# Patient Record
Sex: Female | Born: 1970
Health system: Southern US, Community
[De-identification: ages and names within clinical notes are randomized; demographics above are authoritative.]

## PROBLEM LIST (undated history)

## (undated) ENCOUNTER — Inpatient Hospital Stay: Payer: Self-pay

## (undated) DIAGNOSIS — K649 Unspecified hemorrhoids: Secondary | ICD-10-CM

## (undated) DIAGNOSIS — E039 Hypothyroidism, unspecified: Secondary | ICD-10-CM

## (undated) DIAGNOSIS — E079 Disorder of thyroid, unspecified: Secondary | ICD-10-CM

## (undated) DIAGNOSIS — J029 Acute pharyngitis, unspecified: Secondary | ICD-10-CM

## (undated) DIAGNOSIS — H6691 Otitis media, unspecified, right ear: Secondary | ICD-10-CM

## (undated) DIAGNOSIS — R0981 Nasal congestion: Secondary | ICD-10-CM

## (undated) DIAGNOSIS — R05 Cough: Secondary | ICD-10-CM

## (undated) DIAGNOSIS — N898 Other specified noninflammatory disorders of vagina: Secondary | ICD-10-CM

## (undated) DIAGNOSIS — K5900 Constipation, unspecified: Secondary | ICD-10-CM

## (undated) HISTORY — DX: Constipation, unspecified: K59.00

## (undated) HISTORY — DX: Otitis media, unspecified, right ear: H66.91

## (undated) HISTORY — DX: Unspecified hemorrhoids: K64.9

## (undated) HISTORY — DX: Disorder of thyroid, unspecified: E07.9

## (undated) HISTORY — PX: BAND HEMORRHOIDECTOMY: SHX1213

## (undated) HISTORY — DX: Nasal congestion: R09.81

## (undated) HISTORY — DX: Other specified noninflammatory disorders of vagina: N89.8

## (undated) HISTORY — DX: Hypothyroidism, unspecified: E03.9

## (undated) HISTORY — DX: Acute pharyngitis, unspecified: J02.9

## (undated) HISTORY — DX: Cough: R05

---

## 2007-07-31 ENCOUNTER — Other Ambulatory Visit: Admission: RE | Admit: 2007-07-31 | Discharge: 2007-07-31 | Payer: Self-pay | Admitting: Obstetrics and Gynecology

## 2007-08-07 ENCOUNTER — Ambulatory Visit (HOSPITAL_COMMUNITY): Admission: RE | Admit: 2007-08-07 | Discharge: 2007-08-07 | Payer: Self-pay | Admitting: Obstetrics & Gynecology

## 2010-04-22 ENCOUNTER — Other Ambulatory Visit
Admission: RE | Admit: 2010-04-22 | Discharge: 2010-04-22 | Payer: Self-pay | Source: Home / Self Care | Admitting: Obstetrics and Gynecology

## 2010-05-25 ENCOUNTER — Other Ambulatory Visit: Payer: Self-pay | Admitting: Obstetrics & Gynecology

## 2010-05-25 DIAGNOSIS — R1011 Right upper quadrant pain: Secondary | ICD-10-CM

## 2010-05-27 ENCOUNTER — Ambulatory Visit (HOSPITAL_COMMUNITY)
Admission: RE | Admit: 2010-05-27 | Discharge: 2010-05-27 | Disposition: A | Discharge status: Home / Self Care | Payer: 59 | Source: Ambulatory Visit | Attending: Obstetrics & Gynecology | Admitting: Obstetrics & Gynecology

## 2010-05-27 DIAGNOSIS — R1011 Right upper quadrant pain: Secondary | ICD-10-CM | POA: Insufficient documentation

## 2010-05-30 ENCOUNTER — Emergency Department (HOSPITAL_COMMUNITY)
Admission: EM | Admit: 2010-05-30 | Discharge: 2010-05-30 | Disposition: A | Discharge status: Home / Self Care | Payer: 59 | Attending: Emergency Medicine | Admitting: Emergency Medicine

## 2010-05-30 DIAGNOSIS — L509 Urticaria, unspecified: Secondary | ICD-10-CM | POA: Insufficient documentation

## 2010-10-17 ENCOUNTER — Ambulatory Visit (INDEPENDENT_AMBULATORY_CARE_PROVIDER_SITE_OTHER): Payer: 59 | Admitting: Internal Medicine

## 2010-11-01 ENCOUNTER — Ambulatory Visit (INDEPENDENT_AMBULATORY_CARE_PROVIDER_SITE_OTHER): Payer: 59 | Admitting: Internal Medicine

## 2011-09-14 ENCOUNTER — Other Ambulatory Visit: Payer: Self-pay | Admitting: Adult Health

## 2011-09-14 DIAGNOSIS — Z139 Encounter for screening, unspecified: Secondary | ICD-10-CM

## 2011-09-19 ENCOUNTER — Ambulatory Visit (HOSPITAL_COMMUNITY)
Admission: RE | Admit: 2011-09-19 | Discharge: 2011-09-19 | Disposition: A | Discharge status: Home / Self Care | Payer: 59 | Source: Ambulatory Visit | Attending: Adult Health | Admitting: Adult Health

## 2011-09-19 DIAGNOSIS — Z1231 Encounter for screening mammogram for malignant neoplasm of breast: Secondary | ICD-10-CM | POA: Insufficient documentation

## 2011-09-19 DIAGNOSIS — Z139 Encounter for screening, unspecified: Secondary | ICD-10-CM

## 2011-09-21 ENCOUNTER — Other Ambulatory Visit (HOSPITAL_COMMUNITY)
Admission: RE | Admit: 2011-09-21 | Discharge: 2011-09-21 | Disposition: A | Discharge status: Home / Self Care | Payer: 59 | Source: Ambulatory Visit | Attending: Obstetrics and Gynecology | Admitting: Obstetrics and Gynecology

## 2011-09-21 ENCOUNTER — Other Ambulatory Visit: Payer: Self-pay | Admitting: Adult Health

## 2011-09-21 DIAGNOSIS — Z01419 Encounter for gynecological examination (general) (routine) without abnormal findings: Secondary | ICD-10-CM | POA: Insufficient documentation

## 2011-09-21 DIAGNOSIS — Z1159 Encounter for screening for other viral diseases: Secondary | ICD-10-CM | POA: Insufficient documentation

## 2011-09-25 ENCOUNTER — Other Ambulatory Visit: Payer: Self-pay | Admitting: Adult Health

## 2011-09-27 ENCOUNTER — Ambulatory Visit (HOSPITAL_COMMUNITY)
Admission: RE | Admit: 2011-09-27 | Discharge: 2011-09-27 | Disposition: A | Discharge status: Home / Self Care | Payer: 59 | Source: Ambulatory Visit | Attending: Adult Health | Admitting: Adult Health

## 2011-09-27 DIAGNOSIS — R928 Other abnormal and inconclusive findings on diagnostic imaging of breast: Secondary | ICD-10-CM | POA: Insufficient documentation

## 2012-02-08 ENCOUNTER — Other Ambulatory Visit: Payer: Self-pay | Admitting: Obstetrics & Gynecology

## 2012-02-08 ENCOUNTER — Ambulatory Visit (HOSPITAL_COMMUNITY)
Admission: RE | Admit: 2012-02-08 | Discharge: 2012-02-08 | Disposition: A | Discharge status: Home / Self Care | Payer: 59 | Source: Ambulatory Visit | Attending: Obstetrics & Gynecology | Admitting: Obstetrics & Gynecology

## 2012-02-08 DIAGNOSIS — S59909A Unspecified injury of unspecified elbow, initial encounter: Secondary | ICD-10-CM | POA: Insufficient documentation

## 2012-02-08 DIAGNOSIS — S59919A Unspecified injury of unspecified forearm, initial encounter: Secondary | ICD-10-CM | POA: Insufficient documentation

## 2012-02-08 DIAGNOSIS — S6990XA Unspecified injury of unspecified wrist, hand and finger(s), initial encounter: Secondary | ICD-10-CM

## 2012-02-08 DIAGNOSIS — W19XXXA Unspecified fall, initial encounter: Secondary | ICD-10-CM | POA: Insufficient documentation

## 2012-02-12 ENCOUNTER — Ambulatory Visit (HOSPITAL_COMMUNITY)
Admission: RE | Admit: 2012-02-12 | Discharge: 2012-02-12 | Disposition: A | Discharge status: Home / Self Care | Payer: 59 | Source: Ambulatory Visit | Attending: Adult Health | Admitting: Adult Health

## 2012-02-12 ENCOUNTER — Other Ambulatory Visit: Payer: Self-pay | Admitting: Adult Health

## 2012-02-12 DIAGNOSIS — W19XXXA Unspecified fall, initial encounter: Secondary | ICD-10-CM | POA: Insufficient documentation

## 2012-02-12 DIAGNOSIS — R079 Chest pain, unspecified: Secondary | ICD-10-CM | POA: Insufficient documentation

## 2012-02-12 DIAGNOSIS — R52 Pain, unspecified: Secondary | ICD-10-CM

## 2012-02-12 DIAGNOSIS — S298XXA Other specified injuries of thorax, initial encounter: Secondary | ICD-10-CM | POA: Insufficient documentation

## 2012-10-31 ENCOUNTER — Other Ambulatory Visit: Payer: 59

## 2012-10-31 DIAGNOSIS — Z1329 Encounter for screening for other suspected endocrine disorder: Secondary | ICD-10-CM

## 2012-10-31 DIAGNOSIS — Z13220 Encounter for screening for lipoid disorders: Secondary | ICD-10-CM

## 2012-10-31 DIAGNOSIS — Z0000 Encounter for general adult medical examination without abnormal findings: Secondary | ICD-10-CM

## 2012-10-31 LAB — CBC
Hemoglobin: 13.4 g/dL (ref 12.0–15.0)
MCH: 29.3 pg (ref 26.0–34.0)
MCV: 88.2 fL (ref 78.0–100.0)
RBC: 4.57 MIL/uL (ref 3.87–5.11)

## 2012-11-01 LAB — LIPID PANEL
Cholesterol: 151 mg/dL (ref 0–200)
HDL: 46 mg/dL (ref >39)
Total CHOL/HDL Ratio: 3.3 Ratio
Triglycerides: 52 mg/dL (ref <150)
VLDL: 10 mg/dL (ref 0–40)

## 2012-11-01 LAB — COMPREHENSIVE METABOLIC PANEL
BUN: 12 mg/dL (ref 6–23)
CO2: 28 mEq/L (ref 19–32)
Creat: 0.78 mg/dL (ref 0.50–1.10)
Glucose, Bld: 87 mg/dL (ref 70–99)
Total Bilirubin: 0.4 mg/dL (ref 0.3–1.2)

## 2012-11-01 LAB — THYROID PANEL WITH TSH: Free Thyroxine Index: 3.9 (ref 1.0–3.9)

## 2012-12-04 ENCOUNTER — Other Ambulatory Visit: Payer: Self-pay | Admitting: Adult Health

## 2013-01-06 ENCOUNTER — Other Ambulatory Visit: Payer: Self-pay | Admitting: *Deleted

## 2013-01-06 MED ORDER — PERMETHRIN 5 % EX CREA: TOPICAL_CREAM | Freq: Once | CUTANEOUS | Status: DC

## 2013-01-06 NOTE — Addendum Note (Signed)
Addended by: Richardson Chiquito on: 01/06/2013 05:54 PM   Modules accepted: Medications

## 2013-01-06 NOTE — Addendum Note (Signed)
Addended by: Cyril Mourning A on: 01/06/2013 05:56 PM   Modules accepted: Orders

## 2013-02-12 ENCOUNTER — Ambulatory Visit (INDEPENDENT_AMBULATORY_CARE_PROVIDER_SITE_OTHER): Payer: 59 | Admitting: Adult Health

## 2013-02-12 ENCOUNTER — Encounter: Payer: Self-pay | Admitting: Adult Health

## 2013-02-12 VITALS — BP 108/60 | HR 74 | Ht 63.0 in | Wt 147.0 lb

## 2013-02-12 DIAGNOSIS — E039 Hypothyroidism, unspecified: Secondary | ICD-10-CM

## 2013-02-12 DIAGNOSIS — Z01419 Encounter for gynecological examination (general) (routine) without abnormal findings: Secondary | ICD-10-CM

## 2013-02-12 DIAGNOSIS — K649 Unspecified hemorrhoids: Secondary | ICD-10-CM | POA: Insufficient documentation

## 2013-02-12 DIAGNOSIS — Z1212 Encounter for screening for malignant neoplasm of rectum: Secondary | ICD-10-CM

## 2013-02-12 HISTORY — DX: Hypothyroidism, unspecified: E03.9

## 2013-02-12 HISTORY — DX: Unspecified hemorrhoids: K64.9

## 2013-02-12 LAB — HEMOCCULT GUIAC POC 1CARD (OFFICE)

## 2013-02-12 NOTE — Progress Notes (Signed)
Patient ID: Kristen Hayes, female   DOB: 1970-10-23, 42 y.o.   MRN: [id number] History of Present Illness: Kristen Hayes is a 42 year old white female married in for physical. Had normal pap with negative HPV 09/21/11.  Current Medications, Allergies, Past Medical History, Past Surgical History, Family History and Social History were reviewed in [employer] record.     Review of Systems: Patient denies any headaches, blurred vision, shortness of breath, chest pain, abdominal pain, problems with bowel movements(sonstioation often), urination, or intercourse. No joint pain or mood swings.    Physical Exam:BP 108/60  Pulse 74  Ht 5\' 3"  (1.6 m)  Wt 147 lb (66.679 kg)  BMI 26.05 kg/m2  LMP 01/19/2013 General:  Well developed, well nourished, no acute distress Skin:  Warm and dry Neck:  Midline trachea, normal thyroid Lungs; Clear to auscultation bilaterally Breast:  No dominant palpable mass, retraction, or nipple discharge Cardiovascular: Regular rate and rhythm Abdomen:  Soft, non tender, no hepatosplenomegaly Pelvic:  External genitalia is normal in appearance.  The vagina is normal in appearance.    The cervix is bulbous.  Uterus is felt to be normal size, shape, and contour.  No adnexal masses or tenderness noted. Rectal: Good sphincter tone, no polyps,internal hemorrhoids felt.  Hemoccult negative. Extremities:  No swelling or varicosities noted Psych:  No mood changes,alert and cooperative.   Impression: Yearly gyn exam no pap Hemorrhoids Hypothyroid     Plan: Physical in 1 year Mammogram yearly Continue synthroid Use anusol prn

## 2013-02-12 NOTE — Patient Instructions (Signed)
Physical in 1 year Mammogram yearly  Hemorrhoids Hemorrhoids are swollen veins around the rectum or anus. There are two types of hemorrhoids:   Internal hemorrhoids. These occur in the veins just inside the rectum. They may poke through to the outside and become irritated and painful.  External hemorrhoids. These occur in the veins outside the anus and can be felt as a painful swelling or hard lump near the anus. CAUSES  Pregnancy.   Obesity.   Constipation or diarrhea.   Straining to have a bowel movement.   Sitting for long periods on the toilet.  Heavy lifting or other activity that caused you to strain.  Anal intercourse. SYMPTOMS   Pain.   Anal itching or irritation.   Rectal bleeding.   Fecal leakage.   Anal swelling.   One or more lumps around the anus.  DIAGNOSIS  Your caregiver may be able to diagnose hemorrhoids by visual examination. Other examinations or tests that may be performed include:   Examination of the rectal area with a gloved hand (digital rectal exam).   Examination of anal canal using a small tube (scope).   A blood test if you have lost a significant amount of blood.  A test to look inside the colon (sigmoidoscopy or colonoscopy). TREATMENT Most hemorrhoids can be treated at home. However, if symptoms do not seem to be getting better or if you have a lot of rectal bleeding, your caregiver may perform a procedure to help make the hemorrhoids get smaller or remove them completely. Possible treatments include:   Placing a rubber band at the base of the hemorrhoid to cut off the circulation (rubber band ligation).   Injecting a chemical to shrink the hemorrhoid (sclerotherapy).   Using a tool to burn the hemorrhoid (infrared light therapy).   Surgically removing the hemorrhoid (hemorrhoidectomy).   Stapling the hemorrhoid to block blood flow to the tissue (hemorrhoid stapling).  HOME CARE INSTRUCTIONS   Eat foods with  fiber, such as whole grains, beans, nuts, fruits, and vegetables. Ask your doctor about taking products with added fiber in them (fibersupplements).  Increase fluid intake. Drink enough water and fluids to keep your urine clear or pale yellow.   Exercise regularly.   Go to the bathroom when you have the urge to have a bowel movement. Do not wait.   Avoid straining to have bowel movements.   Keep the anal area dry and clean. Use wet toilet paper or moist towelettes after a bowel movement.   Medicated creams and suppositories may be used or applied as directed.   Only take over-the-counter or prescription medicines as directed by your caregiver.   Take warm sitz baths for 15 20 minutes, 3 4 times a day to ease pain and discomfort.   Place ice packs on the hemorrhoids if they are tender and swollen. Using ice packs between sitz baths may be helpful.   Put ice in a plastic bag.   Place a towel between your skin and the bag.   Leave the ice on for 15 20 minutes, 3 4 times a day.   Do not use a donut-shaped pillow or sit on the toilet for long periods. This increases blood pooling and pain.  SEEK MEDICAL CARE IF:  You have increasing pain and swelling that is not controlled by treatment or medicine.  You have uncontrolled bleeding.  You have difficulty or you are unable to have a bowel movement.  You have pain or inflammation outside the  area of the hemorrhoids. MAKE SURE YOU:  Understand these instructions.  Will watch your condition.  Will get help right away if you are not doing well or get worse. Document Released: 03/31/2000 Document Revised: 03/20/2012 Document Reviewed: 02/06/2012 [facility] Patient Information 2014 [place], [place]. Physical in 1 year

## 2013-02-13 ENCOUNTER — Other Ambulatory Visit: Payer: 59 | Admitting: Adult Health

## 2013-02-20 ENCOUNTER — Other Ambulatory Visit: Payer: Self-pay

## 2013-03-26 ENCOUNTER — Telehealth: Payer: Self-pay | Admitting: *Deleted

## 2013-03-26 NOTE — Telephone Encounter (Signed)
Pt called about a hem. Banding. What day would you like for me to schedule her for that?

## 2013-03-27 NOTE — Telephone Encounter (Signed)
DEC 17 AT 12N OR DEC 18 AT 1145 OR DEC 29 AT 230 PM.

## 2013-03-27 NOTE — Telephone Encounter (Signed)
Message copied by [place] on Thu Mar 27, 2013  9:01 AM ------      Message from: SIMS, CHELSEY R      Created: Wed Mar 26, 2013  9:33 AM       Pt called about a hem. Banding. What day would you like for me to schedule her for that?  ------

## 2013-03-27 NOTE — Telephone Encounter (Signed)
Pt called back for appointment, pt would like something after the first of the year, what are some dates I can give her for then?

## 2013-03-27 NOTE — Telephone Encounter (Signed)
LMOM for pt to call about a appointment

## 2013-04-03 NOTE — Telephone Encounter (Signed)
Patient's appointment is 04-23-13 at 11:30.

## 2013-04-03 NOTE — Telephone Encounter (Signed)
Jan 7 , 8, 14, or 15 at 1130.

## 2013-04-21 ENCOUNTER — Telehealth: Payer: Self-pay | Admitting: Gastroenterology

## 2013-04-21 NOTE — Telephone Encounter (Signed)
After speaking with the coding department(Judy Pasty Arch), I called the patient to let her know her visit for banding is billed like an office visit, lmom

## 2013-04-21 NOTE — Telephone Encounter (Signed)
-----   Message -----  From: Theadora Rama  Sent: 04/16/2013 8:37 AM  To: Idamae Schuller   I have a question for you. Kristen Hayes 04/06/1971 is scheduled for a banding with SF on 04/23/13 at 1130. She works at [employer] and called here yesterday asking how this would be billed to her insurance. I didn't know how to answer that. I did tell her that with other banding patients we collect a specialist co-pay. She asked who would know how to answer this and I told her you would be back on Monday. She asked if you would call her back at [phone] ext [phone]

## 2013-04-23 ENCOUNTER — Encounter: Payer: Self-pay | Admitting: Gastroenterology

## 2013-04-23 ENCOUNTER — Ambulatory Visit (INDEPENDENT_AMBULATORY_CARE_PROVIDER_SITE_OTHER): Payer: 59 | Admitting: Gastroenterology

## 2013-04-23 VITALS — BP 117/72 | HR 81 | Temp 98.2°F | Wt 146.8 lb

## 2013-04-23 DIAGNOSIS — K649 Unspecified hemorrhoids: Secondary | ICD-10-CM

## 2013-04-23 DIAGNOSIS — K648 Other hemorrhoids: Secondary | ICD-10-CM

## 2013-04-23 MED ORDER — LINACLOTIDE 145 MCG PO CAPS: ORAL_CAPSULE | ORAL | Status: DC

## 2013-04-23 NOTE — Progress Notes (Signed)
SYMPTOMS: RECTAL PAIN, ITCHING, BURNING since she was 14. NO CHANGE IN BOWEL HABITS, RECTAL BLEEDING, WEIGHT LOSS.  CONSTIPATION: YES-BM-1-2 X/DAYS PER WEEK. USING MIRALAX WHEN IT GETS BAD. DIARRHEA: NO  STRAINS WITH BMs: YES  TIME SPENT ON TOILET: 5 MINS TISSUE POKES OUT OF RECTUM: NO FIBER SUPPLEMENTS: NO  GLASSES OF WATER/DAY: 6-8: 2 16 OZ AQUAFINA'S A DAY   ADDITIONAL QUESTIONS:  LATEX ALLERGY: NO PREGNANT: NO ERECTILE DYSFUNCTION MEDS OR NITRATES: NO ANTICOAGULATION/ANTIPLATELET MEDS: NO DIAGNOSED WITH CROHN'S DISEASE, PROCTITIS, PORTAL HTN, OR ANAL/RECTAL CA: NO TAKING IMMUNOSUPPRESSANTS/XRT: NO  PLAN: 1. [facility] BANDING TODAY.   PROCEDURE TECHNIQUE: BENEFITS RISK EXPLAINED TO PT. ANOSCOPY PERFORMED. BULGING INTERNAL HEMORRHOID COLUMN IN THE R POSTERIOR AND ANTERIOR COLUMNS. ONE [facility] BAND PLACED IN RIGHT ANTERIOR POSITION. POST-BANDING RECTAL EXAM REVEALED GOOD PLACEMENT. EXAM NON-TENDER.

## 2013-04-23 NOTE — Assessment & Plan Note (Addendum)
R ANT BAND PLACED.  PLAN L LAT/R POS. EXPLAINED INCREASED RISK WITH PLACING MORE THAN ONE BAND TO PT. DRINK WATER EAT FIBER.  ADD LINZESS TO TREAT CONSTIPATION. OPV JAN 28.

## 2013-04-23 NOTE — Patient Instructions (Signed)
FOLLOW A HIGH FIBER DIET. AVOID ITEMS THAT CAUSE BLOATING AND GAS. SEE INFO BELOW.  DRINK WATER TO KEEP YOUR URINE LIGHT YELLOW.  USE FIBER POWDER OR 1 PACKET ONCE DAILY FOR 3 DAYS THEN TWICE DAILY FOR 3 DAYS THEN THREE TIMES A DAY. AVOID HIGHER DOSES IF IT CAUSES BLOATING & GAS.  FOLLOW UP IN JAN 27.    High-Fiber Diet A high-fiber diet changes your normal diet to include more whole grains, legumes, fruits, and vegetables. Changes in the diet involve replacing refined carbohydrates with unrefined foods. The calorie level of the diet is essentially unchanged. The Dietary Reference Intake (recommended amount) for adult males is 38 grams per day. For adult females, it is 25 grams per day. Pregnant and lactating women should consume 28 grams of fiber per day. Fiber is the intact part of a plant that is not broken down during digestion. Functional fiber is fiber that has been isolated from the plant to provide a beneficial effect in the body. PURPOSE  Increase stool bulk.   Ease and regulate bowel movements.   Lower cholesterol.  INDICATIONS THAT YOU NEED MORE FIBER  Constipation and hemorrhoids.   Uncomplicated diverticulosis (intestine condition) and irritable bowel syndrome.   Weight management.   As a protective measure against hardening of the arteries (atherosclerosis), diabetes, and cancer.   DO NOT USE WITH:  Acute diverticulitis (intestine infection).   Partial small bowel obstructions.   Complicated diverticular disease involving bleeding, rupture (perforation), or abscess (boil, furuncle).   Presence of autonomic neuropathy (nerve damage) or gastroparesis (stomach cannot empty itself).    GUIDELINES FOR INCREASING FIBER IN THE DIET  Start adding fiber to the diet slowly. A gradual increase of about 5 more grams (2 slices of whole-wheat bread, 2 servings of most fruits or vegetables, or 1 bowl of high-fiber cereal) per day is best. Too rapid an increase in fiber may  result in constipation, flatulence, and bloating.   Drink enough water and fluids to keep your urine clear or pale yellow. Water, juice, or caffeine-free drinks are recommended. Not drinking enough fluid may cause constipation.   Eat a variety of high-fiber foods rather than one type of fiber.   Try to increase your intake of fiber through using high-fiber foods rather than fiber pills or supplements that contain small amounts of fiber.   The goal is to change the types of food eaten. Do not supplement your present diet with high-fiber foods, but replace foods in your present diet.    INCLUDE A VARIETY OF FIBER SOURCES  Replace refined and processed grains with whole grains, canned fruits with fresh fruits, and incorporate other fiber sources. White rice, white breads, and most bakery goods contain little or no fiber.   Brown whole-grain rice, buckwheat oats, and many fruits and vegetables are all good sources of fiber. These include: broccoli, Brussels sprouts, cabbage, cauliflower, beets, sweet potatoes, white potatoes (skin on), carrots, tomatoes, eggplant, squash, berries, fresh fruits, and dried fruits.   Cereals appear to be the richest source of fiber. Cereal fiber is found in whole grains and bran. Bran is the fiber-rich outer coat of cereal grain, which is largely removed in refining. In whole-grain cereals, the bran remains. In breakfast cereals, the largest amount of fiber is found in those with "bran" in their names. The fiber content is sometimes indicated on the label.   You may need to include additional fruits and vegetables each day.   In baking, for 1 cup  white flour, you may use the following substitutions:   1 cup whole-wheat flour minus 2 tablespoons.   1/2 cup white flour plus 1/2 cup whole-wheat flour.

## 2013-05-13 ENCOUNTER — Encounter: Payer: Self-pay | Admitting: Gastroenterology

## 2013-05-13 ENCOUNTER — Ambulatory Visit (INDEPENDENT_AMBULATORY_CARE_PROVIDER_SITE_OTHER): Payer: 59 | Admitting: Gastroenterology

## 2013-05-13 VITALS — BP 113/72 | HR 73 | Temp 97.4°F | Wt 150.0 lb

## 2013-05-13 DIAGNOSIS — K648 Other hemorrhoids: Secondary | ICD-10-CM

## 2013-05-13 NOTE — Patient Instructions (Signed)
FOLLOW A HIGH FIBER DIET. AVOID ITEMS THAT CAUSE BLOATING AND GAS. SEE INFO BELOW.  DRINK WATER TO KEEP YOUR URINE LIGHT YELLOW.  USE FIBER POWDER OR 1 PACKET ONCE DAILY FOR 3 DAYS THEN TWICE DAILY FOR 3 DAYS THEN THREE TIMES A DAY. AVOID HIGHER DOSES IF IT CAUSES BLOATING & GAS.  SIT FOR LESS THAN 5 MINUTES ON THE COMMODE.  FOLLOW UP IN MAR 2015.    High-Fiber Diet A high-fiber diet changes your normal diet to include more whole grains, legumes, fruits, and vegetables. Changes in the diet involve replacing refined carbohydrates with unrefined foods. The calorie level of the diet is essentially unchanged. The Dietary Reference Intake (recommended amount) for adult males is 38 grams per day. For adult females, it is 25 grams per day. Pregnant and lactating women should consume 28 grams of fiber per day. Fiber is the intact part of a plant that is not broken down during digestion. Functional fiber is fiber that has been isolated from the plant to provide a beneficial effect in the body. PURPOSE  Increase stool bulk.   Ease and regulate bowel movements.   Lower cholesterol.  INDICATIONS THAT YOU NEED MORE FIBER  Constipation and hemorrhoids.   Uncomplicated diverticulosis (intestine condition) and irritable bowel syndrome.   Weight management.   As a protective measure against hardening of the arteries (atherosclerosis), diabetes, and cancer.   DO NOT USE WITH:  Acute diverticulitis (intestine infection).   Partial small bowel obstructions.   Complicated diverticular disease involving bleeding, rupture (perforation), or abscess (boil, furuncle).   Presence of autonomic neuropathy (nerve damage) or gastroparesis (stomach cannot empty itself).    GUIDELINES FOR INCREASING FIBER IN THE DIET  Start adding fiber to the diet slowly. A gradual increase of about 5 more grams (2 slices of whole-wheat bread, 2 servings of most fruits or vegetables, or 1 bowl of high-fiber cereal) per  day is best. Too rapid an increase in fiber may result in constipation, flatulence, and bloating.   Drink enough water and fluids to keep your urine clear or pale yellow. Water, juice, or caffeine-free drinks are recommended. Not drinking enough fluid may cause constipation.   Eat a variety of high-fiber foods rather than one type of fiber.   Try to increase your intake of fiber through using high-fiber foods rather than fiber pills or supplements that contain small amounts of fiber.   The goal is to change the types of food eaten. Do not supplement your present diet with high-fiber foods, but replace foods in your present diet.    INCLUDE A VARIETY OF FIBER SOURCES  Replace refined and processed grains with whole grains, canned fruits with fresh fruits, and incorporate other fiber sources. White rice, white breads, and most bakery goods contain little or no fiber.   Brown whole-grain rice, buckwheat oats, and many fruits and vegetables are all good sources of fiber. These include: broccoli, Brussels sprouts, cabbage, cauliflower, beets, sweet potatoes, white potatoes (skin on), carrots, tomatoes, eggplant, squash, berries, fresh fruits, and dried fruits.   Cereals appear to be the richest source of fiber. Cereal fiber is found in whole grains and bran. Bran is the fiber-rich outer coat of cereal grain, which is largely removed in refining. In whole-grain cereals, the bran remains. In breakfast cereals, the largest amount of fiber is found in those with "bran" in their names. The fiber content is sometimes indicated on the label.   You may need to include additional fruits and  vegetables each day.   In baking, for 1 cup white flour, you may use the following substitutions:   1 cup whole-wheat flour minus 2 tablespoons.   1/2 cup white flour plus 1/2 cup whole-wheat flour.

## 2013-05-13 NOTE — Progress Notes (Signed)
SYMPTOMS: NO RECTAL BLEEDING-CONTINUES WITH RECTAL PRESSURE, PAIN, ITCHING, AND SOILING, BUT BETTER.   CONSTIPATION: YES DIARRHEA: NO  STRAINS WITH BMs: YES  TIME SPENT ON TOILET: 5 MINS TISSUE POKES OUT OF RECTUM: YES FIBER SUPPLEMENTS: NO  GLASSES OF WATER/DAY: 6-8: NO   ADDITIONAL QUESTIONS:  LATEX ALLERGY: NO PREGNANT: NO ERECTILE DYSFUNCTION MEDS OR NITRATES: NO ANTICOAGULATION/ANTIPLATELET MEDS: NO DIAGNOSED WITH CROHN'S DISEASE, PROCTITIS, PORTAL HTN, OR ANAL/RECTAL CA: NO TAKING IMMUNOSUPPRESSANTS/XRT: NO   Plan: 1. [facility] BANDING TODAY  PROCEDURE TECHNIQUE: BENEFITS RISK EXPLAINED TO PT. ONE [facility] BAND PLACED IN LEFT LATERAL & R POSTERIOR POSITION. POST-BANDING RECTAL EXAM REVEALED GOOD PLACEMENT. EXAM NON-TENDER

## 2013-05-14 ENCOUNTER — Encounter: Payer: 59 | Admitting: Gastroenterology

## 2013-05-14 DIAGNOSIS — K648 Other hemorrhoids: Secondary | ICD-10-CM | POA: Insufficient documentation

## 2013-05-14 NOTE — Assessment & Plan Note (Signed)
SX IMPROVED. MAY HAVE RECTAL ITCHING RELIVED BY CREAM.  FOLLOW A HIGH FIBER DIET. AVOID ITEMS THAT CAUSE BLOATING AND GAS.  DRINK WATER TO KEEP YOUR URINE LIGHT YELLOW. USE FIBER POWDER OR 1 PACKET ONCE DAILY FOR 3 DAYS THEN TWICE DAILY FOR 3 DAYS THEN THREE TIMES A DAY. AVOID HIGHER DOSES IF IT CAUSES BLOATING & GAS. SIT FOR LESS THAN 5 MINUTES ON THE COMMODE.  FOLLOW UP IN MAR 2015.

## 2013-06-23 ENCOUNTER — Other Ambulatory Visit: Payer: Self-pay | Admitting: Adult Health

## 2013-06-23 MED ORDER — FLUCONAZOLE 150 MG PO TABS: ORAL_TABLET | ORAL | Status: DC

## 2013-09-04 ENCOUNTER — Other Ambulatory Visit: Payer: Self-pay | Admitting: Adult Health

## 2013-09-04 MED ORDER — LIDOCAINE HCL 2 % EX GEL: CUTANEOUS | Status: DC

## 2013-09-04 MED ORDER — HYDROCORTISONE 2.5 % EX CREA: TOPICAL_CREAM | CUTANEOUS | Status: DC

## 2013-09-10 ENCOUNTER — Encounter: Payer: Self-pay | Admitting: Adult Health

## 2013-09-10 ENCOUNTER — Ambulatory Visit (INDEPENDENT_AMBULATORY_CARE_PROVIDER_SITE_OTHER): Payer: 59 | Admitting: Adult Health

## 2013-09-10 VITALS — BP 110/70 | Ht 63.0 in | Wt 151.4 lb

## 2013-09-10 DIAGNOSIS — N898 Other specified noninflammatory disorders of vagina: Secondary | ICD-10-CM | POA: Insufficient documentation

## 2013-09-10 HISTORY — DX: Other specified noninflammatory disorders of vagina: N89.8

## 2013-09-10 LAB — POCT WET PREP (WET MOUNT): WBC WET PREP: NEGATIVE

## 2013-09-10 NOTE — Progress Notes (Signed)
Subjective:     Patient ID: Kristen Hayes, female   DOB: [birth year], 43 y.o.   MRN: [id number]  HPI Kristen Hayes is a 43 year old white female in complaining of discharge and feels like husband hit something and tampon does not fit as well  Review of Systems See HPI Reviewed past medical,surgical, social and family history. Reviewed medications and allergies.     Objective:   Physical Exam BP 110/70  Ht 5\' 3"  (1.6 m)  Wt 151 lb 6.4 oz (68.675 kg)  BMI 26.83 kg/m2  LMP 08/21/2013   Skin warm and dry.Pelvic: external genitalia is normal in appearance, vagina: white discharge without odor, cervix:smooth and bulbous, uterus: normal size, shape and contour, non tender, no masses felt, adnexa: no masses or tenderness noted. Wet prep: negative Discussed be stool in rectal vault.  Assessment:     Vaginal discharge    Plan:     Follow up prn

## 2013-09-10 NOTE — Patient Instructions (Signed)
Follow up prn

## 2014-01-14 ENCOUNTER — Other Ambulatory Visit: Payer: Self-pay | Admitting: Adult Health

## 2014-01-14 DIAGNOSIS — R921 Mammographic calcification found on diagnostic imaging of breast: Secondary | ICD-10-CM

## 2014-01-14 DIAGNOSIS — Z09 Encounter for follow-up examination after completed treatment for conditions other than malignant neoplasm: Secondary | ICD-10-CM

## 2014-02-09 ENCOUNTER — Encounter: Payer: Self-pay | Admitting: Adult Health

## 2014-02-09 ENCOUNTER — Ambulatory Visit (INDEPENDENT_AMBULATORY_CARE_PROVIDER_SITE_OTHER): Payer: 59 | Admitting: Adult Health

## 2014-02-09 VITALS — BP 120/70 | Temp 98.1°F | Ht 63.0 in | Wt 153.0 lb

## 2014-02-09 DIAGNOSIS — H65111 Acute and subacute allergic otitis media (mucoid) (sanguinous) (serous), right ear: Secondary | ICD-10-CM

## 2014-02-09 DIAGNOSIS — J069 Acute upper respiratory infection, unspecified: Secondary | ICD-10-CM

## 2014-02-09 MED ORDER — HYDROCODONE-CHLORPHENIRAMINE 5-4 MG/5ML PO SOLN: ORAL | Status: DC

## 2014-02-09 MED ORDER — AZITHROMYCIN 250 MG PO TABS: ORAL_TABLET | ORAL | Status: DC

## 2014-02-09 NOTE — Patient Instructions (Signed)
Upper Respiratory Infection, Adult An upper respiratory infection (URI) is also known as the common cold. It is often caused by a type of germ (virus). Colds are easily spread (contagious). You can pass it to others by kissing, coughing, sneezing, or drinking out of the same glass. Usually, you get better in 1 or 2 weeks.  HOME CARE   Only take medicine as told by your doctor.  Use a warm mist humidifier or breathe in steam from a hot shower.  Drink enough water and fluids to keep your pee (urine) clear or pale yellow.  Get plenty of rest.  Return to work when your temperature is back to normal or as told by your doctor. You may use a face mask and wash your hands to stop your cold from spreading. GET HELP RIGHT AWAY IF:   After the first few days, you feel you are getting worse.  You have questions about your medicine.  You have chills, shortness of breath, or brown or red spit (mucus).  You have yellow or brown snot (nasal discharge) or pain in the face, especially when you bend forward.  You have a fever, puffy (swollen) neck, pain when you swallow, or white spots in the back of your throat.  You have a bad headache, ear pain, sinus pain, or chest pain.  You have a high-pitched whistling sound when you breathe in and out (wheezing).  You have a lasting cough or cough up blood.  You have sore muscles or a stiff neck. MAKE SURE YOU:   Understand these instructions.  Will watch your condition.  Will get help right away if you are not doing well or get worse. Document Released: 09/20/2007 Document Revised: 06/26/2011 Document Reviewed: 07/09/2013 [facility] Patient Information 2015 [place], [place]. This information is not intended to replace advice given to you by your health care provider. Make sure you discuss any questions you have with your health care provider. Follow up Take z pack and cough syrup

## 2014-02-09 NOTE — Progress Notes (Addendum)
Subjective:     Patient ID: Kristen Hayes, female   DOB: 26-Jul-1970, 43 y.o.   MRN: [id number]  HPI Kristen Hayes is a 43 year old white female, in complaining of cough and sore throat since Thursday, no fever, has been taking OTC meds and using essential oils in diffuser.   Review of Systems See HPI Reviewed past medical,surgical, social and family history. Reviewed medications and allergies.     Objective:   Physical Exam BP 120/70  Temp(Src) 98.1 F (36.7 C)  Ht 5\' 3"  (1.6 m)  Wt 153 lb (69.4 kg)  BMI 27.11 kg/m2  LMP 01/20/2014   Skin warm and dry. Lungs: clear to ausculation bilaterally. Cardiovascular: regular rate and rhythm.No sinus tenderness, right ear red,left ear clear with pearly gray TM and throat looks irritated, no lymph nodes.  Assessment:    URI Right otitis media    Plan:    Review handout on URI Rx Z pak Rx tussinex cough syrup #120 cc 1 tsp every 12 hours prn cough, no refills   Follow up prn  Keep taking tylenol cold and zicam

## 2014-02-09 NOTE — Addendum Note (Signed)
Addended by: Derrek Monaco A on: 02/09/2014 12:47 PM   Modules accepted: Orders, Medications

## 2014-02-16 ENCOUNTER — Encounter: Payer: Self-pay | Admitting: Adult Health

## 2014-02-23 ENCOUNTER — Other Ambulatory Visit: Payer: Self-pay | Admitting: Adult Health

## 2014-02-24 ENCOUNTER — Ambulatory Visit (HOSPITAL_COMMUNITY)
Admission: RE | Admit: 2014-02-24 | Discharge: 2014-02-24 | Disposition: A | Discharge status: Home / Self Care | Payer: 59 | Source: Ambulatory Visit | Attending: Adult Health | Admitting: Adult Health

## 2014-02-24 DIAGNOSIS — R921 Mammographic calcification found on diagnostic imaging of breast: Secondary | ICD-10-CM | POA: Insufficient documentation

## 2014-02-24 DIAGNOSIS — Z09 Encounter for follow-up examination after completed treatment for conditions other than malignant neoplasm: Secondary | ICD-10-CM

## 2014-02-27 ENCOUNTER — Other Ambulatory Visit: Payer: 59

## 2014-02-27 DIAGNOSIS — Z1329 Encounter for screening for other suspected endocrine disorder: Secondary | ICD-10-CM

## 2014-02-27 DIAGNOSIS — Z0189 Encounter for other specified special examinations: Secondary | ICD-10-CM

## 2014-02-27 DIAGNOSIS — Z13220 Encounter for screening for lipoid disorders: Secondary | ICD-10-CM

## 2014-02-27 LAB — CBC
HEMATOCRIT: 40.5 % (ref 36.0–46.0)
HEMOGLOBIN: 13.1 g/dL (ref 12.0–15.0)
MCH: 29 pg (ref 26.0–34.0)
MCHC: 32.3 g/dL (ref 30.0–36.0)
MCV: 89.6 fL (ref 78.0–100.0)
Platelets: 280 10*3/uL (ref 150–400)
RBC: 4.52 MIL/uL (ref 3.87–5.11)
RDW: 12.9 % (ref 11.5–15.5)
WBC: 4.6 10*3/uL (ref 4.0–10.5)

## 2014-02-28 LAB — COMPREHENSIVE METABOLIC PANEL
ALBUMIN: 4.1 g/dL (ref 3.5–5.2)
ALT: 14 U/L (ref 0–35)
AST: 22 U/L (ref 0–37)
Alkaline Phosphatase: 62 U/L (ref 39–117)
BUN: 11 mg/dL (ref 6–23)
CALCIUM: 8.8 mg/dL (ref 8.4–10.5)
CO2: 28 meq/L (ref 19–32)
Chloride: 102 mEq/L (ref 96–112)
Creat: 0.71 mg/dL (ref 0.50–1.10)
GLUCOSE: 86 mg/dL (ref 70–99)
POTASSIUM: 4 meq/L (ref 3.5–5.3)
SODIUM: 135 meq/L (ref 135–145)
TOTAL PROTEIN: 6.7 g/dL (ref 6.0–8.3)
Total Bilirubin: 0.5 mg/dL (ref 0.2–1.2)

## 2014-02-28 LAB — LIPID PANEL
CHOLESTEROL: 153 mg/dL (ref 0–200)
HDL: 47 mg/dL (ref >39)
LDL Cholesterol: 93 mg/dL (ref 0–99)
TRIGLYCERIDES: 66 mg/dL (ref <150)
Total CHOL/HDL Ratio: 3.3 Ratio
VLDL: 13 mg/dL (ref 0–40)

## 2014-02-28 LAB — TSH: TSH: 0.565 u[IU]/mL (ref 0.350–4.500)

## 2014-03-05 ENCOUNTER — Encounter: Payer: Self-pay | Admitting: Adult Health

## 2014-03-05 ENCOUNTER — Ambulatory Visit (INDEPENDENT_AMBULATORY_CARE_PROVIDER_SITE_OTHER): Payer: 59 | Admitting: Adult Health

## 2014-03-05 VITALS — BP 108/60 | HR 76 | Ht 63.0 in | Wt 153.5 lb

## 2014-03-05 DIAGNOSIS — E039 Hypothyroidism, unspecified: Secondary | ICD-10-CM

## 2014-03-05 DIAGNOSIS — Z01419 Encounter for gynecological examination (general) (routine) without abnormal findings: Secondary | ICD-10-CM

## 2014-03-05 LAB — HEMOCCULT GUIAC POC 1CARD (OFFICE): Fecal Occult Blood, POC: NEGATIVE

## 2014-03-05 NOTE — Progress Notes (Addendum)
Patient ID: Kristen Hayes, female   DOB: 07/27/1970, 43 y.o.   MRN: [id number] History of Present Illness: Kristen Hayes is a 43 year old white female, married in for gyn physical.She had a normal pap with negative HPV 09/21/11.No complaints.got flu shot at work.Had hemorrhoid band and is happy with results.Had labs earlier and they were normal.   Current Medications, Allergies, Past Medical History, Past Surgical History, Family History and Social History were reviewed in [employer] record.     Review of Systems: patient denies any headaches, blurred vision, shortness of breath, chest pain, abdominal pain, problems with bowel movements, urination, or intercourse.  No joint pain or mood swings.    Physical Exam:BP 108/60 mmHg  Pulse 76  Ht 5\' 3"  (1.6 m)  Wt 153 lb 8 oz (69.627 kg)  BMI 27.20 kg/m2  LMP 02/16/2014 General:  Well developed, well nourished, no acute distress Skin:  Warm and dry Neck:  Midline trachea, normal thyroid Lungs; Clear to auscultation bilaterally Breast:  No dominant palpable mass, retraction, or nipple discharge Cardiovascular: Regular rate and rhythm Abdomen:  Soft, non tender, no hepatosplenomegaly Pelvic:  External genitalia is normal in appearance.  The vagina is normal in appearance. The cervix is bulbous.  Uterus is felt to be normal size, shape, and contour.  No   adnexal masses or tenderness noted. Rectal: Good sphincter tone, no polyps, or hemorrhoids felt.  Hemoccult negative. Extremities:  No swelling or varicosities noted Psych:  No mood changes,alert and cooperative,seems happy   Impression: Well woman gyn exam no pap Hypothyroid     Plan: Pap and physical in 1 year Mammogram yearly  Colonoscopy at 54 Continue current meds

## 2014-03-05 NOTE — Patient Instructions (Signed)
Pap and physical in 1 year Mammogram yearly  Colonoscopy at 51 

## 2014-03-06 ENCOUNTER — Ambulatory Visit: Payer: 59 | Admitting: Obstetrics and Gynecology

## 2014-03-17 ENCOUNTER — Ambulatory Visit (INDEPENDENT_AMBULATORY_CARE_PROVIDER_SITE_OTHER): Payer: 59 | Admitting: Orthopedic Surgery

## 2014-03-17 ENCOUNTER — Encounter: Payer: Self-pay | Admitting: Orthopedic Surgery

## 2014-03-17 ENCOUNTER — Ambulatory Visit (INDEPENDENT_AMBULATORY_CARE_PROVIDER_SITE_OTHER): Payer: 59

## 2014-03-17 VITALS — BP 122/73 | Ht 63.0 in | Wt 152.0 lb

## 2014-03-17 DIAGNOSIS — M25532 Pain in left wrist: Secondary | ICD-10-CM

## 2014-03-17 MED ORDER — IBUPROFEN 800 MG PO TABS: 800.0000 mg | ORAL_TABLET | Freq: Three times a day (TID) | ORAL | Status: DC

## 2014-03-17 NOTE — Progress Notes (Signed)
Patient ID: Kristen Hayes, female   DOB: 11/19/1970, 43 y.o.   MRN: [id number] Chief Complaint  Patient presents with  . Wrist Pain    Left wrist pain, no injury.    This patient is presenting with 6 months intermittent pain over the left wrist on its volar aspect. 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 No trauma. Complains of giving way symptoms throbbing and now constant 5 out of 10 pain over the left wrist carpal tunnel area. She did take some ibuprofen without relief  Review of systems negative  She has a history of thyroid disease but no previous surgery she is on Synthroid  No allergies  Family history is negative she has no social habits  BP 122/73 mmHg  Ht 5\' 3"  (1.6 m)  Wt 152 lb (68.947 kg)  BMI 26.93 kg/m2  LMP 03/13/2014 Gen. appearance is normal The patient is alert and oriented person place and time Mood is normal affect is normal Ambulatory status normal The wrist looks normal. There is no swelling. She does have some palpable tenderness over the carpal tunnel and thenar eminence of the left wrist. Range of motion is normal and painless. Shon Baton test is negative. Grip strength seems normal scans intact pulses are normal she has no sensory deficits color is normal. Temperature normal.  Finkelstein's was negative. Phalen's was negative.  X-ray was normal.  I interpret the x-ray as normal wrist  Impression wrist pain possible early carpal tunnel syndrome Encounter Diagnosis  Name Primary?  . Left wrist pain Yes     Recommend carpal tunnel splint for 6 weeks and ibuprofen 800 mg 3 times a day for 6 weeks with a 6 week follow-up

## 2014-04-07 ENCOUNTER — Telehealth: Payer: Self-pay | Admitting: Adult Health

## 2014-04-07 MED ORDER — FLUCONAZOLE 150 MG PO TABS: ORAL_TABLET | ORAL | Status: DC

## 2014-04-07 MED ORDER — NYSTATIN-TRIAMCINOLONE 100000-0.1 UNIT/GM-% EX CREA: 1.0000 "application " | TOPICAL_CREAM | Freq: Two times a day (BID) | CUTANEOUS | Status: DC

## 2014-04-07 NOTE — Telephone Encounter (Signed)
Complaining of itching and discomfort in vagina area, will rx diflucan and mytrex cream

## 2014-04-28 ENCOUNTER — Ambulatory Visit: Payer: 59 | Admitting: Orthopedic Surgery

## 2014-08-13 ENCOUNTER — Telehealth: Payer: Self-pay | Admitting: Adult Health

## 2014-08-13 MED ORDER — LEVOCETIRIZINE DIHYDROCHLORIDE 5 MG PO TABS: 5.0000 mg | ORAL_TABLET | Freq: Every evening | ORAL | Status: DC

## 2014-08-13 MED ORDER — EPINEPHRINE 0.3 MG/0.3ML IJ SOAJ: 0.3000 mg | Freq: Once | INTRAMUSCULAR | Status: DC

## 2014-08-13 MED ORDER — RANITIDINE HCL 300 MG PO TABS: 300.0000 mg | ORAL_TABLET | Freq: Every day | ORAL | Status: DC

## 2014-08-13 NOTE — Telephone Encounter (Signed)
Pt is having hives again will Rx xyzal 5 mg daily,#30 with 1 refill and zantac 300 mg daily,#30 with 1 refill and refill her epi pen with 1 refill at [facility] in [place]

## 2014-08-18 ENCOUNTER — Ambulatory Visit (INDEPENDENT_AMBULATORY_CARE_PROVIDER_SITE_OTHER): Payer: 59 | Admitting: Cardiovascular Disease

## 2014-08-18 ENCOUNTER — Encounter: Payer: Self-pay | Admitting: Cardiovascular Disease

## 2014-08-18 VITALS — BP 110/62 | HR 81 | Ht 63.0 in | Wt 156.4 lb

## 2014-08-18 DIAGNOSIS — R079 Chest pain, unspecified: Secondary | ICD-10-CM

## 2014-08-18 DIAGNOSIS — Z8249 Family history of ischemic heart disease and other diseases of the circulatory system: Secondary | ICD-10-CM | POA: Diagnosis not present

## 2014-08-18 NOTE — Progress Notes (Signed)
Patient ID: Kristen Hayes, female   DOB: 10-Feb-1971, 44 y.o.   MRN: [id number]       CARDIOLOGY CONSULT NOTE  Patient ID: Kristen Hayes MRN: [id number] DOB/AGE: 1970/04/23 44 y.o.  Admit date: (Not on file) Primary Physician Gar Ponto, MD  Reason for Consultation: chest pain  HPI: The patient is a 44 year old woman with a history of hypothyroidism who sees Dr. Quillian Quince in [place] as her primary care physician. She recently had an episode of left-sided chest pain which radiated into her left back. It awoke her from sleep. She drank some milk which helped alleviate her symptoms somewhat. It was intermittent in nature and lasted several hours. She had no symptoms of heartburn and took Zantac which did not help.  ECG performed on 08/12/14 demonstrated normal sinus rhythm with no ischemic ST segment or T-wave abnormalities. Lipid profile on 02/27/14 demonstrated total cholesterol 153, triglycerides 66, HDL 47, LDL 93. TSH was normal.  It initially happened approximately one and a half weeks ago and she had associated lip swelling. She tells me she has been having episodic hives and there is some suspicion that it is related to ibuprofen. She again had a similar episode of sharp chest pain 2 days later but has not had any in one week.  Soc: Married. 2 sons. Nonsmoker. 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 Lives in [place].  Fam: Father had MI at 45. Mother died of MI at 24. Both were smokers and had HTN and hyperlipidemia.  No Known Allergies  Current Outpatient Prescriptions  Medication Sig Dispense Refill  . EPINEPHrine 0.3 mg/0.3 mL IJ SOAJ injection Inject 0.3 mLs (0.3 mg total) into the muscle once. 1 Device 1  . hydrocortisone 2.5 % cream Use 2-3 x daily prn hemoorhoids 30 g 0  . levocetirizine (XYZAL) 5 MG tablet Take 1 tablet (5 mg total) by mouth every evening. 30 tablet 1  . levothyroxine (SYNTHROID, LEVOTHROID) 100 MCG tablet TAKE 1 TABLET BY MOUTH  DAILY 90 tablet 4  . lidocaine (XYLOCAINE) 2 % jelly Use 2-3 x daily prn hemorrhoids 30 mL 0  . nystatin-triamcinolone (MYCOLOG II) cream Apply 1 application topically 2 (two) times daily. 30 g 0  . ranitidine (ZANTAC) 300 MG tablet Take 1 tablet (300 mg total) by mouth at bedtime. 30 tablet 1   No current facility-administered medications for this visit.    Past Medical History  Diagnosis Date  . Thyroid disease   . Constipation   . Hemorrhoids 02/12/2013  . Hypothyroid 02/12/2013  . Vaginal discharge 09/10/2013    Past Surgical History  Procedure Laterality Date  . Band hemorrhoidectomy      History   Social History  . Marital Status: Married    Spouse Name: N/A  . Number of Children: N/A  . Years of Education: N/A   Occupational History  . Not on file.   Social History Main Topics  . Smoking status: Never Smoker   . Smokeless tobacco: Never Used  . Alcohol Use: No  . Drug Use: No  . Sexual Activity: Yes    Birth Control/ Protection: Other-see comments     Comment: vasectomy   Other Topics Concern  . Not on file   Social History Narrative     Prior to Admission medications   Medication Sig Start Date End Date Taking? Authorizing Provider  EPINEPHrine 0.3 mg/0.3 mL IJ SOAJ injection Inject 0.3 mLs (0.3 mg total) into the muscle once.  08/13/14   Estill Dooms, NP  fluconazole (DIFLUCAN) 150 MG tablet Take 1 now and 1 in 3 days if needed 04/07/14   Estill Dooms, NP  hydrocortisone 2.5 % cream Use 2-3 x daily prn hemoorhoids Patient not taking: Reported on 03/17/2014 09/04/13   Estill Dooms, NP  ibuprofen (ADVIL,MOTRIN) 800 MG tablet Take 1 tablet (800 mg total) by mouth 3 (three) times daily. 03/17/14   Carole Civil, MD  levocetirizine (XYZAL) 5 MG tablet Take 1 tablet (5 mg total) by mouth every evening. 08/13/14   Estill Dooms, NP  levothyroxine (SYNTHROID, LEVOTHROID) 100 MCG tablet TAKE 1 TABLET BY MOUTH DAILY 02/23/14   Estill Dooms, NP  lidocaine (XYLOCAINE) 2 % jelly Use 2-3 x daily prn hemorrhoids Patient not taking: Reported on 03/17/2014 09/04/13   Estill Dooms, NP  nystatin-triamcinolone (MYCOLOG II) cream Apply 1 application topically 2 (two) times daily. 04/07/14   Estill Dooms, NP  ranitidine (ZANTAC) 300 MG tablet Take 1 tablet (300 mg total) by mouth at bedtime. 08/13/14   Estill Dooms, NP     Review of systems complete and found to be negative unless listed above in HPI     Physical exam Blood pressure 110/62, pulse 81, height 5\' 3"  (1.6 m), weight 156 lb 6.4 oz (70.943 kg), SpO2 97 %. General: NAD Neck: No JVD, no thyromegaly or thyroid nodule.  Lungs: Clear to auscultation bilaterally with normal respiratory effort. CV: Nondisplaced PMI. Regular rate and rhythm, normal S1/S2, no S3/S4, no murmur.  No peripheral edema.  No carotid bruit.  Normal pedal pulses.  Abdomen: Soft, nontender, no hepatosplenomegaly, no distention.  Skin: Intact without lesions or rashes.  Neurologic: Alert and oriented x 3.  Psych: Normal affect. Extremities: No clubbing or cyanosis.  HEENT: Normal.   ECG: Most recent ECG reviewed.  Labs:   Lab Results  Component Value Date   WBC 4.6 02/27/2014   HGB 13.1 02/27/2014   HCT 40.5 02/27/2014   MCV 89.6 02/27/2014   PLT 280 02/27/2014   No results for input(s): NA, K, CL, CO2, BUN, CREATININE, CALCIUM, PROT, BILITOT, ALKPHOS, ALT, AST, GLUCOSE in the last 168 hours.  Invalid input(s): LABALBU No results found for: CKTOTAL, CKMB, CKMBINDEX, TROPONINI  Lab Results  Component Value Date   CHOL 153 02/27/2014   CHOL 151 10/31/2012   Lab Results  Component Value Date   HDL 47 02/27/2014   HDL 46 10/31/2012   Lab Results  Component Value Date   LDLCALC 93 02/27/2014   LDLCALC 95 10/31/2012   Lab Results  Component Value Date   TRIG 66 02/27/2014   TRIG 52 10/31/2012   Lab Results  Component Value Date   CHOLHDL 3.3 02/27/2014    CHOLHDL 3.3 10/31/2012   No results found for: LDLDIRECT       Studies: No results found.  ASSESSMENT AND PLAN:  1. Chest pain: Somewhat atypical features, especially given the absence of cardiovascular risk factors other than a strong family history of premature CAD, albeit her parents had several cardiovascular risk factors. For thoroughness, will obtain an exercise treadmill stress test. No physical exam findings such as murmur or edema to warrant echocardiography at this time. However, if chest pain recurs, would consider obtaining one.  Dispo: f/u 4-6 weeks.  Signed: Kate Sable, M.D., F.A.C.C.  08/18/2014, 10:45 AM

## 2014-08-18 NOTE — Patient Instructions (Signed)
Your physician recommends that you schedule a follow-up appointment in: 1 month with [facility]   Your physician recommends that you continue on your current medications as directed. Please refer to the Current Medication list given to you today.    Your physician has requested that you have an exercise tolerance test. For further information please visit [email or web address]. Please also follow instruction sheet, as given.     Thank you for choosing [facility] !

## 2014-08-18 NOTE — Addendum Note (Signed)
Addended by: Barbarann Ehlers A on: 08/18/2014 11:05 AM   Modules accepted: Level of Service

## 2014-08-21 ENCOUNTER — Other Ambulatory Visit: Payer: Self-pay | Admitting: Adult Health

## 2014-08-21 MED ORDER — EPINEPHRINE 0.3 MG/0.3ML IJ SOAJ: 0.3000 mg | Freq: Once | INTRAMUSCULAR | Status: DC

## 2014-09-04 ENCOUNTER — Other Ambulatory Visit: Payer: Self-pay | Admitting: Adult Health

## 2014-09-04 MED ORDER — RANITIDINE HCL 300 MG PO TABS: 300.0000 mg | ORAL_TABLET | Freq: Every day | ORAL | Status: DC

## 2014-09-04 MED ORDER — LEVOCETIRIZINE DIHYDROCHLORIDE 5 MG PO TABS: 5.0000 mg | ORAL_TABLET | Freq: Every evening | ORAL | Status: DC

## 2014-09-08 ENCOUNTER — Telehealth: Payer: Self-pay | Admitting: Obstetrics & Gynecology

## 2014-09-08 MED ORDER — PREDNISONE 10 MG PO TABS: ORAL_TABLET | ORAL | Status: DC

## 2014-09-08 NOTE — Telephone Encounter (Signed)
Having an allergy flare which is not responding to xyzal, zantac, benadryl combo  Will try 10 days of prednisone 40 mg and see response, delay allergy testing

## 2014-10-07 ENCOUNTER — Other Ambulatory Visit: Payer: Self-pay | Admitting: Adult Health

## 2014-10-13 ENCOUNTER — Other Ambulatory Visit: Payer: Self-pay | Admitting: Adult Health

## 2014-10-13 DIAGNOSIS — R109 Unspecified abdominal pain: Secondary | ICD-10-CM

## 2014-10-13 DIAGNOSIS — R079 Chest pain, unspecified: Secondary | ICD-10-CM

## 2014-10-14 LAB — H. PYLORI ANTIBODY, IGG: H Pylori IgG: <0.9 U/mL (ref 0.0–0.8)

## 2015-01-15 ENCOUNTER — Ambulatory Visit (INDEPENDENT_AMBULATORY_CARE_PROVIDER_SITE_OTHER): Payer: 59 | Admitting: Adult Health

## 2015-01-15 ENCOUNTER — Other Ambulatory Visit: Payer: Self-pay | Admitting: Adult Health

## 2015-01-15 ENCOUNTER — Encounter: Payer: Self-pay | Admitting: Adult Health

## 2015-01-15 VITALS — BP 120/72 | HR 80 | Ht 63.0 in | Wt 158.0 lb

## 2015-01-15 DIAGNOSIS — H65111 Acute and subacute allergic otitis media (mucoid) (sanguinous) (serous), right ear: Secondary | ICD-10-CM

## 2015-01-15 DIAGNOSIS — J029 Acute pharyngitis, unspecified: Secondary | ICD-10-CM | POA: Diagnosis not present

## 2015-01-15 DIAGNOSIS — R059 Cough, unspecified: Secondary | ICD-10-CM | POA: Insufficient documentation

## 2015-01-15 DIAGNOSIS — H6691 Otitis media, unspecified, right ear: Secondary | ICD-10-CM

## 2015-01-15 DIAGNOSIS — R05 Cough: Secondary | ICD-10-CM | POA: Diagnosis not present

## 2015-01-15 HISTORY — DX: Otitis media, unspecified, right ear: H66.91

## 2015-01-15 HISTORY — DX: Cough, unspecified: R05.9

## 2015-01-15 HISTORY — DX: Acute pharyngitis, unspecified: J02.9

## 2015-01-15 MED ORDER — HYDROCODONE-HOMATROPINE 5-1.5 MG/5ML PO SYRP: 5.0000 mL | ORAL_SOLUTION | Freq: Four times a day (QID) | ORAL | Status: DC | PRN

## 2015-01-15 MED ORDER — AZITHROMYCIN 250 MG PO TABS: ORAL_TABLET | ORAL | Status: DC

## 2015-01-15 NOTE — Progress Notes (Signed)
Subjective:     Patient ID: Kristen Hayes, female   DOB: 1971-02-25, 44 y.o.   MRN: [id number]  HPI Kristen Hayes is a 44 year old white female,married in complaining of sore throat, ears stopped up and hurt and cough with lots of nasal congestion for 4 days,she got flu shot last week.  Review of Systems Patient denies any headaches, hearing loss, fatigue, blurred vision, shortness of breath, chest pain, abdominal pain, problems with bowel movements, urination, or intercourse. No joint pain or mood swings.See HPI for positives. Reviewed past medical,surgical, social and family history. Reviewed medications and allergies.     Objective:   Physical Exam BP 120/72 mmHg  Pulse 80  Ht 5\' 3"  (1.6 m)  Wt 158 lb (71.668 kg)  BMI 28.00 kg/m2  LMP 01/06/2015 Skin warm and dry. Neck: mid line trachea, normal thyroid, good ROM, no lymphadenopathy noted. Lungs: clear to ausculation bilaterally. Cardiovascular: regular rate and rhythm.right ear red and mildly swollen, throat red not swollen, no pustules, no sinus tenderness noted.    Assessment:     Cough Sore throat Right otitis media    Plan:     Rx Z pack Rx hycodan 120 cc 1 tsp every 6 hours prn cough Rest Push fluids Take Mucinex D Warm salt water gargles

## 2015-01-15 NOTE — Patient Instructions (Signed)
Take z pak  push fluids Take mucinex D  rest

## 2015-04-29 ENCOUNTER — Other Ambulatory Visit: Payer: Self-pay | Admitting: Adult Health

## 2015-05-03 ENCOUNTER — Telehealth: Payer: Self-pay | Admitting: Adult Health

## 2015-05-03 DIAGNOSIS — Z139 Encounter for screening, unspecified: Secondary | ICD-10-CM

## 2015-05-03 DIAGNOSIS — Z13220 Encounter for screening for lipoid disorders: Secondary | ICD-10-CM

## 2015-05-03 DIAGNOSIS — E039 Hypothyroidism, unspecified: Secondary | ICD-10-CM

## 2015-05-03 MED FILL — valACYclovir HCL 1 GM TABS: 1 | 90 days supply | Qty: 90 | Fill #0

## 2015-05-03 NOTE — Telephone Encounter (Signed)
Wants fasting labs in am, Check CBC,CMP,TSH and lipids

## 2015-05-04 DIAGNOSIS — Z139 Encounter for screening, unspecified: Secondary | ICD-10-CM | POA: Diagnosis not present

## 2015-05-04 DIAGNOSIS — Z13220 Encounter for screening for lipoid disorders: Secondary | ICD-10-CM | POA: Diagnosis not present

## 2015-05-04 DIAGNOSIS — E039 Hypothyroidism, unspecified: Secondary | ICD-10-CM | POA: Diagnosis not present

## 2015-05-05 LAB — LIPID PANEL
CHOL/HDL RATIO: 5.1 ratio — AB (ref 0.0–4.4)
CHOLESTEROL TOTAL: 167 mg/dL (ref 100–199)
HDL: 33 mg/dL — AB (ref >39)
LDL CALC: 105 mg/dL — AB (ref 0–99)
Triglycerides: 147 mg/dL (ref 0–149)
VLDL CHOLESTEROL CAL: 29 mg/dL (ref 5–40)

## 2015-05-05 LAB — COMPREHENSIVE METABOLIC PANEL
A/G RATIO: 1.4 (ref 1.1–2.5)
ALK PHOS: 75 IU/L (ref 39–117)
ALT: 18 IU/L (ref 0–32)
AST: 17 IU/L (ref 0–40)
Albumin: 4 g/dL (ref 3.5–5.5)
BUN/Creatinine Ratio: 15 (ref 9–23)
BUN: 12 mg/dL (ref 6–24)
Bilirubin Total: 0.3 mg/dL (ref 0.0–1.2)
CALCIUM: 9.2 mg/dL (ref 8.7–10.2)
CO2: 24 mmol/L (ref 18–29)
Chloride: 101 mmol/L (ref 96–106)
Creatinine, Ser: 0.78 mg/dL (ref 0.57–1.00)
GFR calc Af Amer: 108 mL/min/{1.73_m2} (ref >59)
GFR calc non Af Amer: 93 mL/min/{1.73_m2} (ref >59)
GLOBULIN, TOTAL: 2.8 g/dL (ref 1.5–4.5)
Glucose: 92 mg/dL (ref 65–99)
POTASSIUM: 4.5 mmol/L (ref 3.5–5.2)
SODIUM: 138 mmol/L (ref 134–144)
Total Protein: 6.8 g/dL (ref 6.0–8.5)

## 2015-05-05 LAB — CBC
HEMATOCRIT: 43.4 % (ref 34.0–46.6)
Hemoglobin: 14.2 g/dL (ref 11.1–15.9)
MCH: 29.5 pg (ref 26.6–33.0)
MCHC: 32.7 g/dL (ref 31.5–35.7)
MCV: 90 fL (ref 79–97)
PLATELETS: 277 10*3/uL (ref 150–379)
RBC: 4.81 x10E6/uL (ref 3.77–5.28)
RDW: 13.1 % (ref 12.3–15.4)
WBC: 4.5 10*3/uL (ref 3.4–10.8)

## 2015-05-05 LAB — TSH: TSH: 0.678 u[IU]/mL (ref 0.450–4.500)

## 2015-05-20 ENCOUNTER — Other Ambulatory Visit: Payer: 59 | Admitting: Adult Health

## 2015-05-26 ENCOUNTER — Encounter: Payer: Self-pay | Admitting: Adult Health

## 2015-05-26 ENCOUNTER — Ambulatory Visit (INDEPENDENT_AMBULATORY_CARE_PROVIDER_SITE_OTHER): Payer: 59 | Admitting: Adult Health

## 2015-05-26 VITALS — BP 110/70 | HR 76 | Temp 98.7°F | Ht 63.0 in | Wt 160.0 lb

## 2015-05-26 DIAGNOSIS — R05 Cough: Secondary | ICD-10-CM

## 2015-05-26 DIAGNOSIS — R0981 Nasal congestion: Secondary | ICD-10-CM

## 2015-05-26 DIAGNOSIS — R059 Cough, unspecified: Secondary | ICD-10-CM

## 2015-05-26 DIAGNOSIS — J029 Acute pharyngitis, unspecified: Secondary | ICD-10-CM | POA: Diagnosis not present

## 2015-05-26 HISTORY — DX: Nasal congestion: R09.81

## 2015-05-26 MED ORDER — AZITHROMYCIN 250 MG PO TABS: ORAL_TABLET | ORAL | Status: DC

## 2015-05-26 MED FILL — LEVOTHYROXINE 100 MCG TAB: 100 | 90 days supply | Qty: 90 | Fill #0

## 2015-05-26 NOTE — Progress Notes (Signed)
Subjective:     Patient ID: Kristen Hayes, female   DOB: 1970-05-29, 45 y.o.   MRN: [id number]  HPI Kristen Hayes is a 45 year old white female in complaining of nasal congestion, cough ,sore throat, left ear hurts and has bad taste in mouth.Is taking dayquil.Has started cough up phlegm.Has had about 3 days now.   Review of Systems Patient denies any headaches, hearing loss, fatigue, blurred vision, shortness of breath, chest pain, abdominal pain, problems with bowel movements, urination, or intercourse. No joint pain or mood swings.See HPI for positives. Reviewed past medical,surgical, social and family history. Reviewed medications and allergies.     Objective:   Physical Exam BP 110/70 mmHg  Pulse 76  Temp(Src) 98.7 F (37.1 C)  Ht 5\' 3"  (1.6 m)  Wt 160 lb (72.576 kg)  BMI 28.35 kg/m2  LMP 05/22/2015 Skin warm and dry. Neck: mid line trachea, normal thyroid, good ROM, no lymphadenopathy noted. Lungs: clear to ausculation bilaterally. Cardiovascular: regular rate and rhythm.No tenderness over sinus area, ears have wax, throat mildly red, no swelling or pustules.   Has codeine cough syrup at home if needs, will Rx Z pack.  Assessment:     Cough  Nasal congestion Sore throat     Plan:    Rx Z pack Increase fluids Continue dayquil Follow up prn

## 2015-05-26 NOTE — Patient Instructions (Addendum)
Increase fluids Take tylenol Continue dayquil  Take Z pack Follow up prn

## 2015-06-02 ENCOUNTER — Encounter: Payer: Self-pay | Admitting: Adult Health

## 2015-06-02 ENCOUNTER — Ambulatory Visit (INDEPENDENT_AMBULATORY_CARE_PROVIDER_SITE_OTHER): Payer: 59 | Admitting: Adult Health

## 2015-06-02 ENCOUNTER — Other Ambulatory Visit (HOSPITAL_COMMUNITY)
Admission: RE | Admit: 2015-06-02 | Discharge: 2015-06-02 | Disposition: A | Discharge status: Home / Self Care | Payer: 59 | Source: Ambulatory Visit | Attending: Adult Health | Admitting: Adult Health

## 2015-06-02 VITALS — BP 110/72 | HR 77 | Ht 63.0 in | Wt 158.0 lb

## 2015-06-02 DIAGNOSIS — Z1151 Encounter for screening for human papillomavirus (HPV): Secondary | ICD-10-CM | POA: Insufficient documentation

## 2015-06-02 DIAGNOSIS — E039 Hypothyroidism, unspecified: Secondary | ICD-10-CM

## 2015-06-02 DIAGNOSIS — Z1212 Encounter for screening for malignant neoplasm of rectum: Secondary | ICD-10-CM

## 2015-06-02 DIAGNOSIS — Z01419 Encounter for gynecological examination (general) (routine) without abnormal findings: Secondary | ICD-10-CM | POA: Insufficient documentation

## 2015-06-02 LAB — HEMOCCULT GUIAC POC 1CARD (OFFICE): FECAL OCCULT BLD: NEGATIVE

## 2015-06-02 NOTE — Patient Instructions (Signed)
Get mammogram now and yearly Physical in 1 year Pap in 3 years if normal

## 2015-06-02 NOTE — Progress Notes (Signed)
Patient ID: Kristen Hayes, female   DOB: May 29, 1970, 45 y.o.   MRN: [id number] History of Present Illness: Kristen Hayes is a 45 year old white female, married, G2P2, in for a well woman gyn exam and pap. She got flu shot at work in [place].Had labs in January.   Current Medications, Allergies, Past Medical History, Past Surgical History, Family History and Social History were reviewed in [employer] record.     Review of Systems: Patient denies any headaches, hearing loss, fatigue, blurred vision, shortness of breath, chest pain, abdominal pain, problems with bowel movements, urination, or intercourse. No joint pain or mood swings.Wants to lose some weight for wedding in the Fall.    Physical Exam:BP 110/72 mmHg  Pulse 77  Ht 5\' 3"  (1.6 m)  Wt 158 lb (71.668 kg)  BMI 28.00 kg/m2  LMP 05/22/2015 General:  Well developed, well nourished, no acute distress Skin:  Warm and dry Neck:  Midline trachea, normal thyroid, good ROM, no lymphadenopathy Lungs; Clear to auscultation bilaterally Breast:  No dominant palpable mass, retraction, or nipple discharge, has bilateral regular irregularity in UOQ, R>L Cardiovascular: Regular rate and rhythm Abdomen:  Soft, non tender, no hepatosplenomegaly Pelvic:  External genitalia is normal in appearance, no lesions.  The vagina is normal in appearance. Urethra has no lesions or masses. The cervix is bulbous, Pap with HPV performed.  Uterus is felt to be normal size, shape, and contour.  No adnexal masses or tenderness noted.Bladder is non tender, no masses felt. Rectal: Good sphincter tone, no polyps, small internal hemorrhoid felt.  Hemoccult negative. Extremities/musculoskeletal:  No swelling or varicosities noted, no clubbing or cyanosis Psych:  No mood changes, alert and cooperative,seems happy   Impression:  Well woman gyn exam and pap Hypothyroid   Plan: Physical in 1 year, pap in 3 if normal  Mammogram now(past due) and  yearly Watch carbs and increase exercise Labs in a year Continue synthroid

## 2015-06-03 ENCOUNTER — Other Ambulatory Visit: Payer: Self-pay | Admitting: Adult Health

## 2015-06-03 LAB — CYTOLOGY - PAP

## 2015-06-03 MED ORDER — PHENTERMINE HCL 15 MG PO CAPS: 15.0000 mg | ORAL_CAPSULE | ORAL | Status: DC

## 2015-08-16 DIAGNOSIS — Z1283 Encounter for screening for malignant neoplasm of skin: Secondary | ICD-10-CM | POA: Diagnosis not present

## 2015-08-16 DIAGNOSIS — D225 Melanocytic nevi of trunk: Secondary | ICD-10-CM | POA: Diagnosis not present

## 2015-08-19 MED FILL — LEVOTHYROXINE 100 MCG TAB: 100 | 90 days supply | Qty: 90 | Fill #1

## 2015-11-24 MED FILL — LEVOTHYROXINE 100 MCG TAB: 100 | 90 days supply | Qty: 90 | Fill #2

## 2016-01-25 ENCOUNTER — Other Ambulatory Visit: Payer: Self-pay | Admitting: Adult Health

## 2016-01-25 DIAGNOSIS — Z1231 Encounter for screening mammogram for malignant neoplasm of breast: Secondary | ICD-10-CM

## 2016-01-31 ENCOUNTER — Ambulatory Visit (HOSPITAL_COMMUNITY)
Admission: RE | Admit: 2016-01-31 | Discharge: 2016-01-31 | Disposition: A | Discharge status: Home / Self Care | Payer: 59 | Source: Ambulatory Visit | Attending: Adult Health | Admitting: Adult Health

## 2016-01-31 DIAGNOSIS — Z1231 Encounter for screening mammogram for malignant neoplasm of breast: Secondary | ICD-10-CM | POA: Diagnosis not present

## 2016-02-28 MED FILL — LEVOTHYROXINE 100 MCG TAB: 100 | 90 days supply | Qty: 90 | Fill #3

## 2016-03-13 DIAGNOSIS — L918 Other hypertrophic disorders of the skin: Secondary | ICD-10-CM | POA: Diagnosis not present

## 2016-03-13 DIAGNOSIS — L821 Other seborrheic keratosis: Secondary | ICD-10-CM | POA: Diagnosis not present

## 2016-03-30 ENCOUNTER — Encounter (INDEPENDENT_AMBULATORY_CARE_PROVIDER_SITE_OTHER): Payer: 59 | Admitting: Family Medicine

## 2016-05-15 ENCOUNTER — Other Ambulatory Visit: Payer: Self-pay | Admitting: Adult Health

## 2016-05-17 ENCOUNTER — Telehealth: Payer: Self-pay | Admitting: Adult Health

## 2016-05-17 NOTE — Telephone Encounter (Signed)
Spoke with pt. Pt is wanting Levothyroxine called to [facility] in [place]. I called pharmacy and gave a verbal, Levothyroxine 100 mcg take 1 tab by mouth once daily #90 4 refills. Pt advised to check with pharmacy later today. [place]

## 2016-08-07 ENCOUNTER — Other Ambulatory Visit: Payer: Self-pay | Admitting: Adult Health

## 2016-08-07 DIAGNOSIS — Z13220 Encounter for screening for lipoid disorders: Secondary | ICD-10-CM

## 2016-08-07 DIAGNOSIS — E039 Hypothyroidism, unspecified: Secondary | ICD-10-CM

## 2016-08-07 DIAGNOSIS — Z01419 Encounter for gynecological examination (general) (routine) without abnormal findings: Secondary | ICD-10-CM

## 2016-08-07 NOTE — Progress Notes (Signed)
Pt wants labs before physical  

## 2016-08-16 ENCOUNTER — Telehealth: Payer: Self-pay | Admitting: Adult Health

## 2016-08-16 ENCOUNTER — Encounter: Payer: Self-pay | Admitting: Adult Health

## 2016-08-16 ENCOUNTER — Ambulatory Visit (INDEPENDENT_AMBULATORY_CARE_PROVIDER_SITE_OTHER): Payer: 59 | Admitting: Adult Health

## 2016-08-16 VITALS — BP 110/62 | HR 81 | Ht 63.0 in | Wt 142.0 lb

## 2016-08-16 DIAGNOSIS — Z01419 Encounter for gynecological examination (general) (routine) without abnormal findings: Secondary | ICD-10-CM

## 2016-08-16 DIAGNOSIS — Z1212 Encounter for screening for malignant neoplasm of rectum: Secondary | ICD-10-CM | POA: Diagnosis not present

## 2016-08-16 DIAGNOSIS — Z1211 Encounter for screening for malignant neoplasm of colon: Secondary | ICD-10-CM | POA: Diagnosis not present

## 2016-08-16 DIAGNOSIS — E039 Hypothyroidism, unspecified: Secondary | ICD-10-CM | POA: Diagnosis not present

## 2016-08-16 DIAGNOSIS — Z13220 Encounter for screening for lipoid disorders: Secondary | ICD-10-CM | POA: Diagnosis not present

## 2016-08-16 LAB — HEMOCCULT GUIAC POC 1CARD (OFFICE): FECAL OCCULT BLD: NEGATIVE

## 2016-08-16 MED ORDER — HYDROCORTISONE ACE-PRAMOXINE 2.5-1 % RE CREA: 1.0000 "application " | TOPICAL_CREAM | Freq: Three times a day (TID) | RECTAL | 0 refills | Status: DC

## 2016-08-16 NOTE — Progress Notes (Addendum)
Patient ID: Kristen Hayes, female   DOB: 10-Nov-1970, 46 y.o.   MRN: [id number] History of Present Illness: Genita is s 46 year old white female, married,G2P2, in for well woman gyn exam, she had normal pap with negative HPV 06/02/15.She had fasting labs this am.She has lost 16 pound since last February. She is going to [employer]. She works for [employer] in [employer].    Current Medications, Allergies, Past Medical History, Past Surgical History, Family History and Social History were reviewed in [employer] record.     Review of Systems: Patient denies any headaches, hearing loss, fatigue, blurred vision, shortness of breath, chest pain, abdominal pain, problems with bowel movements, urination, or intercourse. No joint pain or mood swings.    Physical Exam:BP 110/62 (BP Location: Right Arm, Patient Position: Sitting, Cuff Size: Normal)   Pulse 81   Ht 5\' 3"  (1.6 m)   Wt 142 lb (64.4 kg)   LMP 07/27/2016 (Exact Date)   BMI 25.15 kg/m  General:  Well developed, well nourished, no acute distress Skin:  Warm and dry Neck:  Midline trachea, normal thyroid, good ROM, no lymphadenopathy Lungs; Clear to auscultation bilaterally Breast:  No dominant palpable mass, retraction, or nipple discharge Cardiovascular: Regular rate and rhythm Abdomen:  Soft, non tender, no hepatosplenomegaly Pelvic:  External genitalia is normal in appearance, no lesions.  The vagina is normal in appearance. Urethra has no lesions or masses. The cervix is bulbous.  Uterus is felt to be normal size, shape, and contour.  No adnexal masses or tenderness noted.Bladder is non tender, no masses felt. Rectal: Good sphincter tone, no polyps, + hemorrhoids felt.  Hemoccult negative. Extremities/musculoskeletal:  No swelling or varicosities noted, no clubbing or cyanosis Psych:  No mood changes, alert and cooperative,seems happy PHQ 2 score.  Impression: 1. Well woman exam with routine gynecological exam    2. Screening for colorectal cancer   3. Hypothyroidism, unspecified type       Plan: Physical in 1 year Pap 2020 Mammogram yearly Continue synthroid, has refills Note given for [employer] for goal weight of 140 lbs.

## 2016-08-16 NOTE — Telephone Encounter (Signed)
Needs refills on analpram [facility]

## 2016-08-17 LAB — COMPREHENSIVE METABOLIC PANEL
A/G RATIO: 1.9 (ref 1.2–2.2)
ALBUMIN: 4.3 g/dL (ref 3.5–5.5)
ALK PHOS: 63 IU/L (ref 39–117)
ALT: 9 IU/L (ref 0–32)
AST: 17 IU/L (ref 0–40)
BUN / CREAT RATIO: 16 (ref 9–23)
BUN: 12 mg/dL (ref 6–24)
Bilirubin Total: 0.3 mg/dL (ref 0.0–1.2)
CO2: 23 mmol/L (ref 18–29)
Calcium: 9.2 mg/dL (ref 8.7–10.2)
Chloride: 102 mmol/L (ref 96–106)
Creatinine, Ser: 0.74 mg/dL (ref 0.57–1.00)
GFR calc Af Amer: 113 mL/min/{1.73_m2} (ref >59)
GFR calc non Af Amer: 98 mL/min/{1.73_m2} (ref >59)
GLOBULIN, TOTAL: 2.3 g/dL (ref 1.5–4.5)
Glucose: 92 mg/dL (ref 65–99)
POTASSIUM: 4.5 mmol/L (ref 3.5–5.2)
SODIUM: 138 mmol/L (ref 134–144)
Total Protein: 6.6 g/dL (ref 6.0–8.5)

## 2016-08-17 LAB — LIPID PANEL
CHOLESTEROL TOTAL: 141 mg/dL (ref 100–199)
Chol/HDL Ratio: 3.2 ratio (ref 0.0–4.4)
HDL: 44 mg/dL (ref >39)
LDL Calculated: 88 mg/dL (ref 0–99)
TRIGLYCERIDES: 47 mg/dL (ref 0–149)
VLDL Cholesterol Cal: 9 mg/dL (ref 5–40)

## 2016-08-17 LAB — CBC
HEMATOCRIT: 41.6 % (ref 34.0–46.6)
Hemoglobin: 13.5 g/dL (ref 11.1–15.9)
MCH: 29.5 pg (ref 26.6–33.0)
MCHC: 32.5 g/dL (ref 31.5–35.7)
MCV: 91 fL (ref 79–97)
Platelets: 283 10*3/uL (ref 150–379)
RBC: 4.58 x10E6/uL (ref 3.77–5.28)
RDW: 13.3 % (ref 12.3–15.4)
WBC: 4.7 10*3/uL (ref 3.4–10.8)

## 2016-08-17 LAB — TSH: TSH: 0.425 u[IU]/mL — AB (ref 0.450–4.500)

## 2016-09-04 MED FILL — LEVOTHYROXINE 100 MCG TABLE: 100 | 90 days supply | Qty: 90 | Fill #0

## 2016-10-04 DIAGNOSIS — L918 Other hypertrophic disorders of the skin: Secondary | ICD-10-CM | POA: Diagnosis not present

## 2016-10-04 DIAGNOSIS — B351 Tinea unguium: Secondary | ICD-10-CM | POA: Diagnosis not present

## 2016-10-04 DIAGNOSIS — L818 Other specified disorders of pigmentation: Secondary | ICD-10-CM | POA: Diagnosis not present

## 2017-04-23 ENCOUNTER — Other Ambulatory Visit: Payer: Self-pay | Admitting: Adult Health

## 2017-04-23 DIAGNOSIS — Z1231 Encounter for screening mammogram for malignant neoplasm of breast: Secondary | ICD-10-CM

## 2017-05-04 ENCOUNTER — Encounter (HOSPITAL_COMMUNITY): Payer: Self-pay

## 2017-05-04 ENCOUNTER — Ambulatory Visit (HOSPITAL_COMMUNITY)
Admission: RE | Admit: 2017-05-04 | Discharge: 2017-05-04 | Disposition: A | Discharge status: Home / Self Care | Payer: No Typology Code available for payment source | Source: Ambulatory Visit | Attending: Adult Health | Admitting: Adult Health

## 2017-05-04 DIAGNOSIS — Z1231 Encounter for screening mammogram for malignant neoplasm of breast: Secondary | ICD-10-CM | POA: Diagnosis not present

## 2017-05-07 ENCOUNTER — Telehealth: Payer: Self-pay | Admitting: Adult Health

## 2017-05-07 MED ORDER — VALACYCLOVIR HCL 1 G PO TABS: 1000.0000 mg | ORAL_TABLET | Freq: Every day | ORAL | 2 refills | Status: DC

## 2017-05-07 MED ORDER — HYDROCORTISONE ACE-PRAMOXINE 2.5-1 % RE CREA: 1.0000 "application " | TOPICAL_CREAM | Freq: Three times a day (TID) | RECTAL | 3 refills | Status: DC

## 2017-05-07 MED FILL — valACYclovir HCL 1 GM TABS: 1 | 90 days supply | Qty: 90 | Fill #0

## 2017-05-07 MED FILL — HYDROCORT-PRAMOXINE 2.5-1%: 2.5-1 | 10 days supply | Qty: 30 | Fill #0

## 2017-05-07 NOTE — Telephone Encounter (Signed)
Left message that meds sent to [facility]

## 2017-05-18 ENCOUNTER — Ambulatory Visit (HOSPITAL_COMMUNITY): Payer: 59

## 2017-05-31 ENCOUNTER — Other Ambulatory Visit: Payer: Self-pay | Admitting: Adult Health

## 2017-05-31 MED FILL — LEVOTHYROXINE 100 MCG TABLE: 100 | 90 days supply | Qty: 90 | Fill #0

## 2017-08-22 MED FILL — LEVOTHYROXINE 100 MCG TABLE: 100 | 90 days supply | Qty: 90 | Fill #1

## 2017-08-30 ENCOUNTER — Other Ambulatory Visit: Payer: 59 | Admitting: Adult Health

## 2017-09-04 ENCOUNTER — Encounter: Payer: Self-pay | Admitting: Adult Health

## 2017-09-04 ENCOUNTER — Other Ambulatory Visit: Payer: No Typology Code available for payment source

## 2017-09-04 ENCOUNTER — Ambulatory Visit (INDEPENDENT_AMBULATORY_CARE_PROVIDER_SITE_OTHER): Payer: No Typology Code available for payment source | Admitting: Adult Health

## 2017-09-04 VITALS — BP 110/80 | HR 97 | Ht 63.0 in | Wt 139.4 lb

## 2017-09-04 DIAGNOSIS — Z01411 Encounter for gynecological examination (general) (routine) with abnormal findings: Secondary | ICD-10-CM | POA: Diagnosis not present

## 2017-09-04 DIAGNOSIS — E039 Hypothyroidism, unspecified: Secondary | ICD-10-CM

## 2017-09-04 DIAGNOSIS — Z01419 Encounter for gynecological examination (general) (routine) without abnormal findings: Secondary | ICD-10-CM

## 2017-09-04 DIAGNOSIS — Z1212 Encounter for screening for malignant neoplasm of rectum: Secondary | ICD-10-CM

## 2017-09-04 DIAGNOSIS — Z1211 Encounter for screening for malignant neoplasm of colon: Secondary | ICD-10-CM

## 2017-09-04 DIAGNOSIS — Z13220 Encounter for screening for lipoid disorders: Secondary | ICD-10-CM

## 2017-09-04 DIAGNOSIS — K219 Gastro-esophageal reflux disease without esophagitis: Secondary | ICD-10-CM | POA: Diagnosis not present

## 2017-09-04 LAB — HEMOCCULT GUIAC POC 1CARD (OFFICE): Fecal Occult Blood, POC: NEGATIVE

## 2017-09-04 MED ORDER — PANTOPRAZOLE SODIUM 20 MG PO TBEC: 20.0000 mg | DELAYED_RELEASE_TABLET | Freq: Every day | ORAL | 3 refills | Status: DC

## 2017-09-04 MED FILL — PANTOPRAZOLE SOD DR 20 MG T: 20 | 90 days supply | Qty: 90 | Fill #0

## 2017-09-04 NOTE — Progress Notes (Signed)
Patient ID: Kristen Hayes, female   DOB: 1971/02/23, 47 y.o.   MRN: [id number] History of Present Illness: Kristen Hayes is a 47 year old white female, married, in for a well woman gyn exam,she had a normal pap with negative HPV 06/02/15.   Current Medications, Allergies, Past Medical History, Past Surgical History, Family History and Social History were reviewed in [employer] record.     Review of Systems: Patient denies any headaches, hearing loss, fatigue, blurred vision, shortness of breath, chest pain, abdominal pain, problems with bowel movements, urination, or intercourse. No joint pain or mood swings. +reflux with cough     Physical Exam:BP 110/80 (BP Location: Right Arm, Patient Position: Sitting, Cuff Size: Small)   Pulse 97   Ht 5\' 3"  (1.6 m)   Wt 139 lb 6.4 oz (63.2 kg)   LMP 08/09/2017   BMI 24.69 kg/m  General:  Well developed, well nourished, no acute distress Skin:  Warm and dry Neck:  Midline trachea, normal thyroid, good ROM, no lymphadenopathy Lungs; Clear to auscultation bilaterally Breast:  No dominant palpable mass, retraction, or nipple discharge Cardiovascular: Regular rate and rhythm Abdomen:  Soft, non tender, no hepatosplenomegaly Pelvic:  External genitalia is normal in appearance, no lesions.  The vagina is normal in appearance. Urethra has no lesions or masses. The cervix is bulbous.  Uterus is felt to be normal size, shape, and contour.  No adnexal masses or tenderness noted.Bladder is non tender, no masses felt. Rectal: Good sphincter tone, no polyps, or hemorrhoids felt.  Hemoccult negative. Extremities/musculoskeletal:  No swelling or varicosities noted, no clubbing or cyanosis Psych:  No mood changes, alert and cooperative,seems happy PHQ 9 score 0.   Impression: 1. Well woman exam with routine gynecological exam   2. Screening for colorectal cancer   3. Hypothyroidism, unspecified type   4. Gastroesophageal reflux disease without  esophagitis       Plan: She had labs this morning Meds ordered this encounter  Medications  . pantoprazole (PROTONIX) 20 MG tablet    Sig: Take 1 tablet (20 mg total) by mouth daily.    Dispense:  90 tablet    Refill:  3    Order Specific Question:   Supervising Provider    Answer:   Tania Ade H [[id number]]  Continue synthroid has refills Physical and pap in 1 year Mammogram yearly

## 2017-09-05 LAB — COMPREHENSIVE METABOLIC PANEL
A/G RATIO: 1.9 (ref 1.2–2.2)
ALT: 13 IU/L (ref 0–32)
AST: 21 IU/L (ref 0–40)
Albumin: 4.4 g/dL (ref 3.5–5.5)
Alkaline Phosphatase: 58 IU/L (ref 39–117)
BUN/Creatinine Ratio: 13 (ref 9–23)
BUN: 11 mg/dL (ref 6–24)
Bilirubin Total: 0.4 mg/dL (ref 0.0–1.2)
CALCIUM: 9.4 mg/dL (ref 8.7–10.2)
CO2: 24 mmol/L (ref 20–29)
CREATININE: 0.82 mg/dL (ref 0.57–1.00)
Chloride: 104 mmol/L (ref 96–106)
GFR, EST AFRICAN AMERICAN: 99 mL/min/{1.73_m2} (ref >59)
GFR, EST NON AFRICAN AMERICAN: 86 mL/min/{1.73_m2} (ref >59)
Globulin, Total: 2.3 g/dL (ref 1.5–4.5)
Glucose: 99 mg/dL (ref 65–99)
POTASSIUM: 4.3 mmol/L (ref 3.5–5.2)
Sodium: 141 mmol/L (ref 134–144)
TOTAL PROTEIN: 6.7 g/dL (ref 6.0–8.5)

## 2017-09-05 LAB — CBC
Hematocrit: 41.3 % (ref 34.0–46.6)
Hemoglobin: 13.2 g/dL (ref 11.1–15.9)
MCH: 29.1 pg (ref 26.6–33.0)
MCHC: 32 g/dL (ref 31.5–35.7)
MCV: 91 fL (ref 79–97)
Platelets: 260 10*3/uL (ref 150–450)
RBC: 4.54 x10E6/uL (ref 3.77–5.28)
RDW: 13.4 % (ref 12.3–15.4)
WBC: 4.1 10*3/uL (ref 3.4–10.8)

## 2017-09-05 LAB — LIPID PANEL
CHOL/HDL RATIO: 3.2 ratio (ref 0.0–4.4)
Cholesterol, Total: 147 mg/dL (ref 100–199)
HDL: 46 mg/dL (ref >39)
LDL CALC: 92 mg/dL (ref 0–99)
Triglycerides: 47 mg/dL (ref 0–149)
VLDL CHOLESTEROL CAL: 9 mg/dL (ref 5–40)

## 2017-09-05 LAB — TSH: TSH: 0.953 u[IU]/mL (ref 0.450–4.500)

## 2017-10-22 ENCOUNTER — Ambulatory Visit (INDEPENDENT_AMBULATORY_CARE_PROVIDER_SITE_OTHER): Payer: No Typology Code available for payment source

## 2017-10-22 ENCOUNTER — Ambulatory Visit: Payer: No Typology Code available for payment source | Admitting: Orthopedic Surgery

## 2017-10-22 ENCOUNTER — Encounter: Payer: Self-pay | Admitting: Orthopedic Surgery

## 2017-10-22 VITALS — BP 102/70 | HR 76 | Ht 63.0 in | Wt 140.0 lb

## 2017-10-22 DIAGNOSIS — M7521 Bicipital tendinitis, right shoulder: Secondary | ICD-10-CM

## 2017-10-22 DIAGNOSIS — M25511 Pain in right shoulder: Secondary | ICD-10-CM

## 2017-10-22 NOTE — Patient Instructions (Addendum)
Biceps Tendon Tendinitis (Proximal) and Tenosynovitis The proximal biceps tendon is a strong cord of tissue that connects the biceps muscle, on the front of the upper arm, to the shoulder blade. Tendinitis is inflammation of a tendon. Tenosynovitis is inflammation of the lining around the tendon (tendon sheath). These conditions often occur at the same time, and they can interfere with the ability to bend the elbow and turn the hand palm-up (supination). Proximal biceps tendon tendinitis and tenosynovitis are usually caused by overusing the shoulder joint and the biceps muscle. These conditions usually heal within 6 weeks. Proximal biceps tendon tendinitis may include a grade 1 or grade 2 strain of the tendon. A grade 1 strain is mild, and it involves a slight pull of the tendon without any stretching or noticeable tearing of the tendon. There is usually no loss of biceps muscle strength. A grade 2 strain is moderate, and it involves a small tear in the tendon. The tendon is stretched, and biceps strength is usually decreased. What are the causes? This condition may be caused by:  A sudden increase in frequency or intensity of activity that involves the shoulder and the biceps muscle.  Overuse of the biceps muscle. This can happen when you do the same movements over and over, such as: ? Supination. ? Forceful straightening (hyperextension) of the elbow. ? Bending the elbow.  A direct, forceful hit or injury (trauma) to the elbow. This is rare.  What increases the risk? The following factors may make you more likely to develop this condition:  Playing contact sports.  Playing sports that involve throwing and overhead movements, including racket sports, gymnastics, weight lifting, or bodybuilding.  Doing physical labor.  Having poor strength and flexibility of the arm and shoulder.  What are the signs or symptoms? Symptoms of this condition may include:  Pain and inflammation in the front  of the shoulder. Pain may get worse with movement, especially when you use resistance, as in weight lifting.  A feeling of warmth in the front of the shoulder.  Limited range of motion of the shoulder and the elbow.  A crackling sound (crepitation) when you move or touch the shoulder or the upper arm.  In some cases, symptoms may return (recur) after treatment, and they may be long-lasting (chronic). How is this diagnosed? This condition is diagnosed based on your symptoms, your medical history, and a physical exam. You may have tests, including X-rays or MRIs. Your health care provider may test your range of motion by asking you to do arm movements. How is this treated? This condition is treated by resting and icing the injured area, and by doing physical therapy exercises. Depending on the severity of your condition, treatment may also include:  Medicines to help relieve pain and inflammation.  Ultrasound therapy. This is the application of sound waves to the injured area.  Injecting medicines (corticosteroids) into your tendon sheath.  Injecting medicines that numb the area (local anesthetics).  Surgery to remove the damaged part of the tendon and reattach the undamaged part of the tendon to the arm bone (humerus). This is usually only done if you have symptoms that do not get better with other treatment methods.  Follow these instructions at home: Managing pain, stiffness, and swelling  If directed, put ice on the injured area: ? Put ice in a plastic bag. ? Place a towel between your skin and the bag. ? Leave the ice on for 20 minutes, 2-3 times a day.    Move your fingers often to avoid stiffness and to lessen swelling.  Raise (elevate) the injured area above the level of your heart while you are sitting or lying down.  If directed, apply heat to the affected area before you exercise. Use the heat source that your health care provider recommends, such as a moist heat pack or a  heating pad. ? Place a towel between your skin and the heat source. ? Leave the heat on for 20-30 minutes. ? Remove the heat if your skin turns bright red. This is especially important if you are unable to feel pain, heat, or cold. You may have a greater risk of getting burned. Activity  Return to your normal activities as told by your health care provider. Ask your health care provider what activities are safe for you.  Do not lift anything that is heavier than 10 lb (4.5 kg) until your health care provider tells you that it is safe.  Avoid activities that cause pain or make your condition worse.  Do exercises as told by your health care provider. General instructions  Take over-the-counter and prescription medicines only as told by your health care provider.  Do not drive or operate heavy machinery while taking prescription pain medicines.  Keep all follow-up visits as told by your health care provider. This is important. How is this prevented?  Warm up and stretch before being active.  Cool down and stretch after being active.  Give your body time to rest between periods of activity.  Make sure any equipment that you use is fitted to you.  Be safe and responsible while being active to avoid falls.  Do at least 150 minutes of moderate-intensity aerobic exercise each week, such as brisk walking or water aerobics.  Maintain physical fitness, including: ? Strength. ? Flexibility. ? Cardiovascular fitness. ? Endurance. Contact a health care provider if:  You have symptoms that get worse or do not get better after 2 weeks of treatment.  You develop new symptoms. Get help right away if:  You develop severe pain. This information is not intended to replace advice given to you by your health care provider. Make sure you discuss any questions you have with your health care provider. Document Released: 04/03/2005 Document Revised: 12/09/2015 Document Reviewed:  03/12/2015 Elsevier Interactive Patient Education  [employer].   These are the muscle and arthrits creams I recommend:  PLEASE READ THE PACKAGE INSTRUCTIONS BEFORE USING   Ben Gay arthritis cream  Icy hot vanishing gel  Aspercreme odor free  Myoflex Oderless pain reliever  Capzasin  Sportscreme  Max freeze

## 2017-10-22 NOTE — Progress Notes (Signed)
  NEW PATIENT OFFICE VISI  Chief Complaint  Patient presents with  . Shoulder Pain    right     47 years old presents for evaluation of right shoulder pain  She reports a 30-month history of anterior right shoulder pain described as a dull ache tolerable mild pain associated with only certain movements across the front of the shoulder and anteriorly lifting the arm.  She did try some Naprosyn but it upset her stomach so she stopped it   Review of Systems  Constitutional: Negative for fever.  Skin: Negative.   Neurological: Negative for tingling, focal weakness and weakness.     Past Medical History:  Diagnosis Date  . Constipation   . Cough 01/15/2015  . Hemorrhoids 02/12/2013  . Hypothyroid 02/12/2013  . Nasal congestion 05/26/2015  . Otitis media of right ear 01/15/2015  . Sore throat 01/15/2015  . Thyroid disease   . Vaginal discharge 09/10/2013    Past Surgical History:  Procedure Laterality Date  . BAND HEMORRHOIDECTOMY      Family History  Problem Relation Age of Onset  . Heart disease Mother        heart attack  . Diabetes Father   . Heart disease Father   . Bowel Disease Brother        Crohn's   Social History   Tobacco Use  . Smoking status: Never Smoker  . Smokeless tobacco: Never Used  Substance Use Topics  . Alcohol use: No  . Drug use: No    No Known Allergies  Current Meds  Medication Sig  . levothyroxine (SYNTHROID, LEVOTHROID) 100 MCG tablet TAKE 1 TABLET BY MOUTH DAILY    BP 102/70   Pulse 76   Ht 5\' 3"  (1.6 m)   Wt 140 lb (63.5 kg)   LMP 09/24/2017   BMI 24.80 kg/m   Physical Exam  Constitutional: She is oriented to person, place, and time. She appears well-developed and well-nourished.  Neurological: She is alert and oriented to person, place, and time.  Psychiatric: She has a normal mood and affect. Judgment normal.  Vitals reviewed.   Right Shoulder Exam  Right shoulder exam is normal.  Tenderness  Right shoulder  tenderness location: Anterior shoulder pain in the bicipital groove.  Range of Motion  The patient has normal right shoulder ROM.  Muscle Strength  The patient has normal right shoulder strength.  Tests  Apprehension: negative  Other  Erythema: absent Sensation: normal Pulse: present  Comments:  Positive Speed test   Left Shoulder Exam  Left shoulder exam is normal.  Tenderness  The patient is experiencing no tenderness.   Range of Motion  The patient has normal left shoulder ROM.  Muscle Strength  The patient has normal left shoulder strength.  Tests  Apprehension: negative  Other  Erythema: absent Sensation: normal Pulse: present       MEDICAL DECISION SECTION  Xrays were done at Normal right shoulder   Encounter Diagnoses  Name Primary?  . Acute pain of right shoulder   . Tendonitis of upper biceps tendon of right shoulder Yes    PLAN: (Rx., injectx, surgery, frx, mri/ct) Recommend injection and topical medication  Injection bicipital groove right shoulder  Patient gave permission timeout confirmed site 40 mg of Depo-Medrol and lidocaine 1% 3 cc injected no complications  No orders of the defined types were placed in this encounter.   Arther Abbott, MD  10/22/2017 4:19 PM

## 2017-11-13 ENCOUNTER — Encounter: Payer: Self-pay | Admitting: Adult Health

## 2017-11-13 ENCOUNTER — Ambulatory Visit: Payer: No Typology Code available for payment source | Admitting: Adult Health

## 2017-11-13 VITALS — BP 106/67 | HR 76 | Ht 63.0 in | Wt 139.0 lb

## 2017-11-13 DIAGNOSIS — R3915 Urgency of urination: Secondary | ICD-10-CM | POA: Diagnosis not present

## 2017-11-13 DIAGNOSIS — R3989 Other symptoms and signs involving the genitourinary system: Secondary | ICD-10-CM | POA: Diagnosis not present

## 2017-11-13 DIAGNOSIS — R350 Frequency of micturition: Secondary | ICD-10-CM

## 2017-11-13 LAB — POCT URINALYSIS DIPSTICK
GLUCOSE UA: NEGATIVE
Ketones, UA: NEGATIVE
LEUKOCYTES UA: NEGATIVE
Nitrite, UA: NEGATIVE
PROTEIN UA: NEGATIVE
RBC UA: NEGATIVE

## 2017-11-13 MED ORDER — SULFAMETHOXAZOLE-TRIMETHOPRIM 800-160 MG PO TABS: 1.0000 | ORAL_TABLET | Freq: Two times a day (BID) | ORAL | 0 refills | Status: DC

## 2017-11-13 MED ORDER — PHENAZOPYRIDINE HCL 200 MG PO TABS: 200.0000 mg | ORAL_TABLET | Freq: Three times a day (TID) | ORAL | 0 refills | Status: DC | PRN

## 2017-11-13 NOTE — Progress Notes (Signed)
  Subjective:     Patient ID: Kristen Hayes, female   DOB: 02-Jun-1970, 47 y.o.   MRN: [id number]  HPI Racheal is a 47 year old white female, married, worked in for urinary frequency and pressure and urgency, today. Back hurt some.  Review of Systems +urianry frequency and pressure  +urinary urgency Back hurt some  Reviewed past medical,surgical, social and family history. Reviewed medications and allergies.     Objective:   Physical Exam BP 106/67 (BP Location: Right Arm, Patient Position: Sitting, Cuff Size: Normal)   Pulse 76   Ht 5\' 3"  (1.6 m)   Wt 139 lb (63 kg)   LMP 10/29/2017 (Exact Date)   BMI 24.62 kg/m urine dipstick negative,Skin warm and dry. No CVAT, but pressure over bladder when palpated.    Assessment:     1. Sensation of pressure in bladder area   2. Urinary frequency   3. Urinary urgency       Plan:     UA C&S sent Meds ordered this encounter  Medications  . sulfamethoxazole-trimethoprim (BACTRIM DS,SEPTRA DS) 800-160 MG tablet    Sig: Take 1 tablet by mouth 2 (two) times daily. Take 1 bid    Dispense:  14 tablet    Refill:  0    Order Specific Question:   Supervising Provider    Answer:   Elonda Husky, LUTHER H [[id number]]  . phenazopyridine (PYRIDIUM) 200 MG tablet    Sig: Take 1 tablet (200 mg total) by mouth 3 (three) times daily as needed for pain.    Dispense:  10 tablet    Refill:  0    Order Specific Question:   Supervising Provider    Answer:   Florian Buff [[id number]]  Push fluids F/U prn

## 2017-11-14 LAB — URINALYSIS, ROUTINE W REFLEX MICROSCOPIC
Bilirubin, UA: NEGATIVE
GLUCOSE, UA: NEGATIVE
Ketones, UA: NEGATIVE
Leukocytes, UA: NEGATIVE
Nitrite, UA: NEGATIVE
PH UA: 6 (ref 5.0–7.5)
PROTEIN UA: NEGATIVE
RBC, UA: NEGATIVE
Specific Gravity, UA: 1.021 (ref 1.005–1.030)
Urobilinogen, Ur: 0.2 mg/dL (ref 0.2–1.0)

## 2017-11-15 ENCOUNTER — Telehealth: Payer: Self-pay | Admitting: Adult Health

## 2017-11-15 LAB — URINE CULTURE: Organism ID, Bacteria: NO GROWTH

## 2017-11-15 NOTE — Telephone Encounter (Signed)
Pt aware urine culture no growth, so don't take septra but increase water

## 2017-12-03 MED FILL — LEVOTHYROXINE 100 MCG TABLE: 100 | 90 days supply | Qty: 90 | Fill #2

## 2018-03-06 MED FILL — LEVOTHYROXINE 100 MCG TABLE: 100 | 90 days supply | Qty: 90 | Fill #3

## 2018-05-29 ENCOUNTER — Other Ambulatory Visit: Payer: Self-pay | Admitting: Adult Health

## 2018-05-29 MED FILL — LEVOTHYROXINE 100 MCG TABLE: 100 | 90 days supply | Qty: 90 | Fill #4

## 2018-05-29 MED FILL — valACYclovir HCL 1 GM TABS: 1 | 90 days supply | Qty: 90 | Fill #0

## 2018-07-23 ENCOUNTER — Other Ambulatory Visit: Payer: Self-pay | Admitting: Adult Health

## 2018-07-24 MED FILL — LEVOTHYROXINE 100 MCG TAB: 100 | 90 days supply | Qty: 90 | Fill #0

## 2018-10-18 MED FILL — LEVOTHYROXINE 100 MCG TAB: 100 | 90 days supply | Qty: 90 | Fill #1

## 2018-10-31 ENCOUNTER — Other Ambulatory Visit (HOSPITAL_COMMUNITY): Payer: Self-pay | Admitting: Adult Health

## 2018-10-31 DIAGNOSIS — Z1231 Encounter for screening mammogram for malignant neoplasm of breast: Secondary | ICD-10-CM

## 2018-11-04 ENCOUNTER — Ambulatory Visit (INDEPENDENT_AMBULATORY_CARE_PROVIDER_SITE_OTHER): Payer: No Typology Code available for payment source | Admitting: Adult Health

## 2018-11-04 ENCOUNTER — Other Ambulatory Visit: Payer: Self-pay

## 2018-11-04 ENCOUNTER — Other Ambulatory Visit (HOSPITAL_COMMUNITY)
Admission: RE | Admit: 2018-11-04 | Discharge: 2018-11-04 | Disposition: A | Discharge status: Home / Self Care | Payer: No Typology Code available for payment source | Source: Ambulatory Visit | Attending: Adult Health | Admitting: Adult Health

## 2018-11-04 ENCOUNTER — Encounter: Payer: Self-pay | Admitting: Adult Health

## 2018-11-04 VITALS — BP 99/61 | HR 89 | Ht 63.0 in | Wt 148.0 lb

## 2018-11-04 DIAGNOSIS — Z1211 Encounter for screening for malignant neoplasm of colon: Secondary | ICD-10-CM | POA: Diagnosis not present

## 2018-11-04 DIAGNOSIS — Z01419 Encounter for gynecological examination (general) (routine) without abnormal findings: Secondary | ICD-10-CM | POA: Diagnosis present

## 2018-11-04 DIAGNOSIS — Z1212 Encounter for screening for malignant neoplasm of rectum: Secondary | ICD-10-CM | POA: Diagnosis not present

## 2018-11-04 DIAGNOSIS — E039 Hypothyroidism, unspecified: Secondary | ICD-10-CM

## 2018-11-04 LAB — HEMOCCULT GUIAC POC 1CARD (OFFICE): Fecal Occult Blood, POC: NEGATIVE

## 2018-11-04 NOTE — Progress Notes (Signed)
Patient ID: Kristen Hayes, female   DOB: 07-25-1970, 48 y.o.   MRN: [id number] History of Present Illness: Kristen Hayes is a 48 year old white female, married, G2P2 in for a well woman gyn exam and pap. PCP is Dr Quillian Quince.    Current Medications, Allergies, Past Medical History, Past Surgical History, Family History and Social History were reviewed in [employer] record.     Review of Systems:  Patient denies any headaches, hearing loss, fatigue, blurred vision, shortness of breath, chest pain, abdominal pain, problems with bowel movements, urination, or intercourse. No joint pain or mood swings.   Physical Exam:BP 99/61 (BP Location: Left Arm, Patient Position: Sitting, Cuff Size: Normal)   Pulse 89   Ht 5\' 3"  (1.6 m)   Wt 148 lb (67.1 kg)   LMP 10/08/2018 (Exact Date)   BMI 26.22 kg/m  General:  Well developed, well nourished, no acute distress Skin:  Warm and dry Neck:  Midline trachea, normal thyroid, good ROM, no lymphadenopathy Lungs; Clear to auscultation bilaterally Breast:  No dominant palpable mass, retraction, or nipple discharge Cardiovascular: Regular rate and rhythm Abdomen:  Soft, non tender, no hepatosplenomegaly Pelvic:  External genitalia is normal in appearance, no lesions.  The vagina is normal in appearance. Urethra has no lesions or masses. The cervix is smoeth, pap with HPV with genotyping 16/18 performed .  Uterus is felt to be normal size, shape, and contour.  No adnexal masses or tenderness noted.Bladder is non tender, no masses felt. Rectal: Good sphincter tone, no polyps, or hemorrhoids felt.  Hemoccult negative. Extremities/musculoskeletal:  No swelling or varicosities noted, no clubbing or cyanosis Psych:  No mood changes, alert and cooperative,seems happy PHQ 2 score 0. Fall risk is low. Exam is performed without chaperone with pt's permission.  Reviewed labs she had for life insurance and they were all normal. But will need TSH today.     Impression: 1. Encounter for gynecological examination with Papanicolaou smear of cervix   2. Screening for colorectal cancer   3. Hypothyroidism, unspecified type       Plan: Physical in 1 year Pap in 3 if normal Mammogram yearly Labs as needed Colonoscopy at 50  Check TSH and  free T4.

## 2018-11-06 LAB — CYTOLOGY - PAP
Diagnosis: NEGATIVE
HPV: NOT DETECTED

## 2018-11-07 LAB — T4, FREE: Free T4: 1.58 ng/dL (ref 0.82–1.77)

## 2018-11-07 LAB — TSH: TSH: 0.053 u[IU]/mL — ABNORMAL LOW (ref 0.450–4.500)

## 2018-11-25 ENCOUNTER — Other Ambulatory Visit: Payer: Self-pay | Admitting: Women's Health

## 2018-11-25 MED ORDER — FLUCONAZOLE 150 MG PO TABS: 150.0000 mg | ORAL_TABLET | Freq: Once | ORAL | 0 refills | Status: AC

## 2018-12-05 ENCOUNTER — Other Ambulatory Visit: Payer: Self-pay

## 2018-12-05 ENCOUNTER — Ambulatory Visit (HOSPITAL_COMMUNITY)
Admission: RE | Admit: 2018-12-05 | Discharge: 2018-12-05 | Disposition: A | Discharge status: Home / Self Care | Payer: No Typology Code available for payment source | Source: Ambulatory Visit | Attending: Adult Health | Admitting: Adult Health

## 2018-12-05 ENCOUNTER — Encounter (HOSPITAL_COMMUNITY): Payer: Self-pay

## 2018-12-05 DIAGNOSIS — Z1231 Encounter for screening mammogram for malignant neoplasm of breast: Secondary | ICD-10-CM | POA: Diagnosis present

## 2018-12-09 ENCOUNTER — Other Ambulatory Visit (HOSPITAL_COMMUNITY): Payer: Self-pay | Admitting: Adult Health

## 2018-12-09 DIAGNOSIS — R921 Mammographic calcification found on diagnostic imaging of breast: Secondary | ICD-10-CM

## 2018-12-17 ENCOUNTER — Ambulatory Visit (HOSPITAL_COMMUNITY)
Admission: RE | Admit: 2018-12-17 | Discharge: 2018-12-17 | Disposition: A | Discharge status: Home / Self Care | Payer: No Typology Code available for payment source | Source: Ambulatory Visit | Attending: Adult Health | Admitting: Adult Health

## 2018-12-17 ENCOUNTER — Ambulatory Visit (HOSPITAL_COMMUNITY): Payer: No Typology Code available for payment source

## 2018-12-17 ENCOUNTER — Other Ambulatory Visit: Payer: Self-pay

## 2018-12-17 DIAGNOSIS — R921 Mammographic calcification found on diagnostic imaging of breast: Secondary | ICD-10-CM | POA: Diagnosis not present

## 2018-12-24 ENCOUNTER — Other Ambulatory Visit: Payer: Self-pay | Admitting: Women's Health

## 2018-12-24 MED ORDER — LEVOTHYROXINE SODIUM 100 MCG PO TABS: 100.0000 ug | ORAL_TABLET | Freq: Every day | ORAL | 0 refills | Status: DC

## 2018-12-30 ENCOUNTER — Other Ambulatory Visit: Payer: Self-pay | Admitting: Women's Health

## 2018-12-30 MED ORDER — METRONIDAZOLE 500 MG PO TABS: 500.0000 mg | ORAL_TABLET | Freq: Two times a day (BID) | ORAL | 0 refills | Status: DC

## 2019-01-15 MED FILL — LEVOTHYROXINE 100 MCG TAB: 100 | 90 days supply | Qty: 90 | Fill #2

## 2019-01-20 ENCOUNTER — Other Ambulatory Visit: Payer: Self-pay | Admitting: Adult Health

## 2019-01-20 MED FILL — PANTOPRAZOLE SOD DR 20 MG T: 20 | 90 days supply | Qty: 90 | Fill #0

## 2019-04-09 MED FILL — valACYclovir HCL 1 GM TABS: 1 | 30 days supply | Qty: 30 | Fill #0

## 2019-04-09 MED FILL — LEVOTHYROXINE SODIUM 100 MC: 100 | 90 days supply | Qty: 90 | Fill #3

## 2019-04-09 MED FILL — PANTOPRAZOLE SOD DR 20 MG T: 20 | 90 days supply | Qty: 90 | Fill #1

## 2019-04-14 ENCOUNTER — Other Ambulatory Visit: Payer: Self-pay | Admitting: Adult Health

## 2019-04-15 ENCOUNTER — Other Ambulatory Visit: Payer: Self-pay

## 2019-04-15 ENCOUNTER — Ambulatory Visit: Payer: No Typology Code available for payment source | Admitting: Orthopaedic Surgery

## 2019-04-15 ENCOUNTER — Encounter: Payer: Self-pay | Admitting: Orthopaedic Surgery

## 2019-04-15 VITALS — BP 115/69 | HR 69 | Temp 97.2°F | Ht 63.0 in | Wt 148.0 lb

## 2019-04-15 DIAGNOSIS — M25511 Pain in right shoulder: Secondary | ICD-10-CM

## 2019-04-15 MED ORDER — NAPROXEN 500 MG PO TABS: 500.0000 mg | ORAL_TABLET | Freq: Two times a day (BID) | ORAL | 5 refills | Status: DC

## 2019-04-15 MED FILL — NAPROXEN 500 MG TABS: 500 | 30 days supply | Qty: 60 | Fill #0

## 2019-04-15 MED FILL — HYDROCORT-PRAMOXINE 2.5-1%: 2.5-1 | 10 days supply | Qty: 30 | Fill #0

## 2019-04-15 NOTE — Progress Notes (Signed)
Patient ID:Kristen Hayes, female DOB:06/17/1970, 48 y.o. VN:[id number]  Chief Complaint  Patient presents with  . Shoulder Pain    Right shoulder pain.    HPI  Kristen Hayes is a 48 y.o. female who has history of right shoulder pain. She was seen by Dr. Aline Brochure in July of 2019. She has done well until recently when she began having more pain of the right shoulder. She has noticed some numbness of both hands that started just recently, more after using a computer, no nocturnal pain.  She has no trauma. She has no redness.   Body mass index is 26.22 kg/m.  ROS  Review of Systems  Constitutional: Positive for activity change.  Musculoskeletal: Positive for arthralgias.  All other systems reviewed and are negative.   All other systems reviewed and are negative.  The following is a summary of the past history medically, past history surgically, known current medicines, social history and family history.  This information is gathered electronically by the computer from prior information and documentation.  I review this each visit and have found including this information at this point in the chart is beneficial and informative.    Past Medical History:  Diagnosis Date  . Constipation   . Cough 01/15/2015  . Hemorrhoids 02/12/2013  . Hypothyroid 02/12/2013  . Nasal congestion 05/26/2015  . Otitis media of right ear 01/15/2015  . Sore throat 01/15/2015  . Thyroid disease   . Vaginal discharge 09/10/2013    Past Surgical History:  Procedure Laterality Date  . BAND HEMORRHOIDECTOMY      Family History  Problem Relation Age of Onset  . Heart disease Mother        heart attack  . Diabetes Father   . Heart disease Father   . Bowel Disease Brother        Crohn's    Social History Social History   Tobacco Use  . Smoking status: Never Smoker  . Smokeless tobacco: Never Used  Substance Use Topics  . Alcohol use: No  . Drug use: No    No Known Allergies  Current Outpatient  Medications  Medication Sig Dispense Refill  . levothyroxine (SYNTHROID) 100 MCG tablet Take 1 tablet (100 mcg total) by mouth daily. 14 tablet 0  . hydrocortisone-pramoxine (ANALPRAM-HC) 2.5-1 % rectal cream PLACE 1 APPLICATION RECTALLY 3 TIMES DAILY. (Patient not taking: Reported on 04/15/2019) 30 g 3  . metroNIDAZOLE (FLAGYL) 500 MG tablet Take 1 tablet (500 mg total) by mouth 2 (two) times daily. (Patient not taking: Reported on 04/15/2019) 14 tablet 0  . naproxen (NAPROSYN) 500 MG tablet Take 1 tablet (500 mg total) by mouth 2 (two) times daily with a meal. 60 tablet 5  . pantoprazole (PROTONIX) 20 MG tablet TAKE 1 TABLET (20 MG TOTAL) BY MOUTH DAILY. (Patient not taking: Reported on 04/15/2019) 90 tablet 3   No current facility-administered medications for this visit.     Physical Exam  Blood pressure 115/69, pulse 69, temperature (!) 97.2 F (36.2 C), height 5\' 3"  (1.6 m), weight 148 lb (67.1 kg).  Constitutional: overall normal hygiene, normal nutrition, well developed, normal grooming, normal body habitus. Assistive device:none  Musculoskeletal: gait and station Limp none, muscle tone and strength are normal, no tremors or atrophy is present.  .  Neurological: coordination overall normal.  Deep tendon reflex/nerve stretch intact.  Sensation normal.  Cranial nerves II-XII intact.   Skin:   Normal overall no scars, lesions, ulcers or rashes. No  psoriasis.  Psychiatric: Alert and oriented x 3.  Recent memory intact, remote memory unclear.  Normal mood and affect. Well groomed.  Good eye contact.  Cardiovascular: overall no swelling, no varicosities, no edema bilaterally, normal temperatures of the legs and arms, no clubbing, cyanosis and good capillary refill.  Lymphatic: palpation is normal.  Right shoulder has full ROM. Negative Tinel and Phalen of wrist.  NV intact.  ROM neck is full. All other systems reviewed and are negative   The patient has been educated about the  nature of the problem(s) and counseled on treatment options.  The patient appeared to understand what I have discussed and is in agreement with it.  Encounter Diagnosis  Name Primary?  . Acute pain of right shoulder Yes    PLAN Call if any problems.  Precautions discussed.  Continue current medications and begin Naprosyn.  Return to clinic 3 weeks   Begin Naprosyn.  Electronically Signed Sanjuana Kava, MD 12/29/20202:28 PM

## 2019-05-09 MED FILL — NAPROXEN 500 MG TABS: 500 | 30 days supply | Qty: 60 | Fill #1

## 2019-05-29 ENCOUNTER — Other Ambulatory Visit: Payer: Self-pay | Admitting: Adult Health

## 2019-05-29 DIAGNOSIS — E039 Hypothyroidism, unspecified: Secondary | ICD-10-CM

## 2019-05-29 NOTE — Progress Notes (Signed)
Will check anti thyroid peroxidase and antithyroglobulin antibodies, has known hypothyroidism and now has elevated ANA, to rule out Hashimoto's

## 2019-06-06 ENCOUNTER — Telehealth: Payer: Self-pay | Admitting: Adult Health

## 2019-06-06 DIAGNOSIS — E039 Hypothyroidism, unspecified: Secondary | ICD-10-CM

## 2019-06-06 NOTE — Telephone Encounter (Signed)
Referred to Dr Dorris Fetch. Pt aware of labs and that would refer

## 2019-06-13 LAB — THYROGLOBULIN LEVEL: Thyroglobulin (TG-RIA): 4.6 ng/mL

## 2019-06-13 LAB — THYROID PEROXIDASE ANTIBODY: Thyroperoxidase Ab SerPl-aCnc: 119 IU/mL — ABNORMAL HIGH (ref 0–34)

## 2019-06-19 ENCOUNTER — Other Ambulatory Visit: Payer: Self-pay | Admitting: "Endocrinology

## 2019-06-19 ENCOUNTER — Ambulatory Visit (INDEPENDENT_AMBULATORY_CARE_PROVIDER_SITE_OTHER): Payer: Commercial Managed Care - PPO | Admitting: "Endocrinology

## 2019-06-19 ENCOUNTER — Encounter: Payer: Self-pay | Admitting: "Endocrinology

## 2019-06-19 ENCOUNTER — Other Ambulatory Visit: Payer: Self-pay

## 2019-06-19 VITALS — BP 106/71 | HR 76 | Ht 63.0 in | Wt 153.0 lb

## 2019-06-19 DIAGNOSIS — E038 Other specified hypothyroidism: Secondary | ICD-10-CM | POA: Diagnosis not present

## 2019-06-19 DIAGNOSIS — E063 Autoimmune thyroiditis: Secondary | ICD-10-CM | POA: Diagnosis not present

## 2019-06-19 NOTE — Progress Notes (Signed)
Endocrinology Consult Note                                         06/19/2019, 6:07 PM   Kristen Hayes is a 49 y.o.-year-old female patient being seen in consultation for hypothyroidism referred by Caryl Bis, MD.   Past Medical History:  Diagnosis Date  . Constipation   . Cough 01/15/2015  . Hemorrhoids 02/12/2013  . Hypothyroid 02/12/2013  . Nasal congestion 05/26/2015  . Otitis media of right ear 01/15/2015  . Sore throat 01/15/2015  . Thyroid disease   . Vaginal discharge 09/10/2013    Past Surgical History:  Procedure Laterality Date  . BAND HEMORRHOIDECTOMY      Social History   Socioeconomic History  . Marital status: Married    Spouse name: Not on file  . Number of children: Not on file  . Years of education: Not on file  . Highest education level: Not on file  Occupational History  . Not on file  Tobacco Use  . Smoking status: Never Smoker  . Smokeless tobacco: Never Used  Substance and Sexual Activity  . Alcohol use: No  . Drug use: No  . Sexual activity: Yes    Birth control/protection: Other-see comments    Comment: vasectomy  Other Topics Concern  . Not on file  Social History Narrative  . Not on file   Social Determinants of Health   Financial Resource Strain:   . Difficulty of Paying Living Expenses: Not on file  Food Insecurity:   . Worried About Charity fundraiser in the Last Year: Not on file  . Ran Out of Food in the Last Year: Not on file  Transportation Needs:   . Lack of Transportation (Medical): Not on file  . Lack of Transportation (Non-Medical): Not on file  Physical Activity:   . Days of Exercise per Week: Not on file  . Minutes of Exercise per Session: Not on file  Stress:   . Feeling of Stress : Not on file  Social Connections:   . Frequency of Communication with Friends and Family: Not on file  . Frequency of Social Gatherings with Friends and  Family: Not on file  . Attends Religious Services: Not on file  . Active Member of Clubs or Organizations: Not on file  . Attends Archivist Meetings: Not on file  . Marital Status: Not on file    Family History  Problem Relation Age of Onset  . Heart disease Mother        heart attack  . Thyroid disease Mother   . Hypertension Mother   . Hyperlipidemia Mother   . Heart attack Mother   . Diabetes Father   . Heart disease Father   . Hypertension Father   . Hyperlipidemia Father   . Heart attack Father   . Bowel Disease Brother        Crohn's  Outpatient Encounter Medications as of 06/19/2019  Medication Sig  . cetirizine (ZYRTEC) 10 MG tablet Take 10 mg by mouth daily.  . Famotidine (PEPCID AC PO) Take 1 tablet by mouth daily.  . hydrocortisone-pramoxine (ANALPRAM-HC) 2.5-1 % rectal cream PLACE 1 APPLICATION RECTALLY 3 TIMES DAILY. (Patient not taking: Reported on 04/15/2019)  . levothyroxine (SYNTHROID) 100 MCG tablet Take 1 tablet (100 mcg total) by mouth daily.  . [DISCONTINUED] metroNIDAZOLE (FLAGYL) 500 MG tablet Take 1 tablet (500 mg total) by mouth 2 (two) times daily. (Patient not taking: Reported on 04/15/2019)  . [DISCONTINUED] naproxen (NAPROSYN) 500 MG tablet Take 1 tablet (500 mg total) by mouth 2 (two) times daily with a meal.  . [DISCONTINUED] pantoprazole (PROTONIX) 20 MG tablet TAKE 1 TABLET (20 MG TOTAL) BY MOUTH DAILY. (Patient not taking: Reported on 04/15/2019)   No facility-administered encounter medications on file as of 06/19/2019.    ALLERGIES: No Known Allergies VACCINATION STATUS: Immunization History  Administered Date(s) Administered  . Influenza-Unspecified 12/30/2013     HPI    Kristen Hayes  is a patient with the above medical history. she was diagnosed  with hypothyroidism at approximate age of 37 years with which required subsequnt initiation of thyroid hormone replacement. she was given various doses of levothyroxine over the  years, currently on levothyroxine 100 micrograms. she reports compliance to this medication:  Taking it daily on empty stomach  with water, separated by >30 minutes before breakfast and other medications , and by at least 4 hours from   calcium, iron, PPIs, multivitamins .  She does not have recent thyroid function tests reviewed.  Her last thyroid function test from July 2020 showed suppressed TSH of 0.053, along with free T4 of 1.58. Her recent labs show evidence of Hashimoto's thyroiditis. -Her main concern today is explained intermittent  rash, suspect to have celiac disease and wants to be screened for that. -She describes stable weight. -She denies palpitations, heat intolerance. -She denies dysphagia, shortness of breath, voice change.  She has family history of what appears to be hypothyroidism in her mother. -She is a mother of 2 grown children and has grand children.  Pt denies feeling nodules in neck, hoarseness,  she denies any family history of thyroid malignancy.   No history of thyroidectomy, nor radiation therapy to head or neck.   ROS:  Constitutional: + weight gain, no fatigue, no subjective hyperthermia, no subjective hypothermia Eyes: no blurry vision, no xerophthalmia ENT: no sore throat, no nodules palpated in throat, no dysphagia/odynophagia, no hoarseness Cardiovascular: no Chest Pain, no Shortness of Breath, no palpitations, no leg swelling Respiratory: no cough, no SOB Gastrointestinal: no Nausea/Vomiting/Diarhhea Musculoskeletal: no muscle/joint aches Skin: + rashes Neurological: no tremors, no numbness, no tingling, no dizziness Psychiatric: no depression, no anxiety   Physical Exam: BP 106/71   Pulse 76   Ht 5\' 3"  (1.6 m)   Wt 153 lb (69.4 kg)   BMI 27.10 kg/m  Wt Readings from Last 3 Encounters:  06/19/19 153 lb (69.4 kg)  04/15/19 148 lb (67.1 kg)  11/04/18 148 lb (67.1 kg)    Constitutional:  Body mass index is 27.1 kg/m., not in acute  distress, normal state of mind Eyes: PERRLA, EOMI, no exophthalmos ENT: moist mucous membranes, no thyromegaly, no cervical lymphadenopathy Cardiovascular: normal precordial activity, Regular Rate and Rhythm, no Murmur/Rubs/Gallops Respiratory:  adequate breathing efforts, no gross chest deformity, Clear to auscultation bilaterally Gastrointestinal: abdomen soft, Non -tender, No distension, Bowel Sounds present  Musculoskeletal: no gross deformities, strength intact in all four extremities Skin: moist, warm, no rashes Neurological: no tremor with outstretched hands, Deep tendon reflexes normal in all four extremities.   CMP ( most recent) CMP     Component Value Date/Time   NA 141 09/04/2017 0853   K 4.3 09/04/2017 0853   CL 104 09/04/2017 0853   CO2 24 09/04/2017 0853   GLUCOSE 99 09/04/2017 0853   GLUCOSE 86 02/27/2014 0906   BUN 11 09/04/2017 0853   CREATININE 0.82 09/04/2017 0853   CREATININE 0.71 02/27/2014 0906   CALCIUM 9.4 09/04/2017 0853   PROT 6.7 09/04/2017 0853   ALBUMIN 4.4 09/04/2017 0853   AST 21 09/04/2017 0853   ALT 13 09/04/2017 0853   ALKPHOS 58 09/04/2017 0853   BILITOT 0.4 09/04/2017 0853   GFRNONAA 86 09/04/2017 0853   GFRAA 99 09/04/2017 0853     Lipid Panel ( most recent) Lipid Panel     Component Value Date/Time   CHOL 147 09/04/2017 0853   TRIG 47 09/04/2017 0853   HDL 46 09/04/2017 0853   CHOLHDL 3.2 09/04/2017 0853   CHOLHDL 3.3 02/27/2014 0906   VLDL 13 02/27/2014 0906   LDLCALC 92 09/04/2017 0853   LABVLDL 9 09/04/2017 0853     Lab Results  Component Value Date   TSH 0.053 (L) 11/06/2018   TSH 0.953 09/04/2017   TSH 0.425 (L) 08/16/2016   TSH 0.678 05/04/2015   TSH 0.565 02/27/2014   TSH 0.951 10/31/2012   FREET4 1.58 11/06/2018     ASSESSMENT: 1. Hypothyroidism 2.  Hashimoto's thyroiditis  PLAN:    Patient with long-standing hypothyroidism, on levothyroxine therapy. On physical exam , patient  does not  have  gross  goiter, thyroid nodules, or neck compression symptoms.  -She will need a new set of thyroid function tests before making adjustment.  In the meantime, she is advised to continue levothyroxine 100 mcg p.o. daily before breakfast.  - We discussed about correct intake of levothyroxine, at fasting, with water, separated by at least 30 minutes from breakfast, and separated by more than 4 hours from calcium, iron, multivitamins, acid reflux medications (PPIs). -Patient is made aware of the fact that thyroid hormone replacement is needed for life, dose to be adjusted by periodic monitoring of thyroid function tests. -She will be sent to lab for free T4, free T3, TSH.  She will also have celiac panel with reflex to screen for celiac disease.  -She is advised on the genetic nature of Hashimoto's thyroiditis so that she can get her kids and grandkids to be screened for thyroid dysfunction at least once a year.   -Due to absence of clinical goiter, no need for thyroid ultrasound.   - Time spent with the patient: 60 minutes, of which >50% was spent in obtaining information about her symptoms, reviewing her previous labs, evaluations, and treatments, counseling her about her hypothyroidism due to Hashimoto's thyroiditis, and developing a plan to confirm the diagnosis and long term treatment as necessary. Please refer to " Patient Self Inventory" in the Media  tab for reviewed elements of pertinent patient history.  Kristen Hayes participated in the discussions, expressed understanding, and voiced agreement with the above plans.  All questions were answered to her satisfaction. she is encouraged to contact clinic should she have any questions or concerns prior to her return visit.  Return in about 10 days (around 06/29/2019), or office, for Labs Today- Non-Fasting Ok.  Glade Lloyd,  MD [facility] [address] [place], [place] [address] Phone: [phone]   Fax: [phone]   06/19/2019, 6:07 PM  This note was partially dictated with voice recognition software. Similar sounding words can be transcribed inadequately or may not  be corrected upon review.

## 2019-06-22 LAB — CELIAC DISEASE PANEL
Endomysial IgA: NEGATIVE
IgA/Immunoglobulin A, Serum: 157 mg/dL (ref 87–352)
Transglutaminase IgA: <2 U/mL (ref 0–3)

## 2019-06-22 LAB — T3, FREE: T3, Free: 3.2 pg/mL (ref 2.0–4.4)

## 2019-06-22 LAB — T4, FREE: Free T4: 1.73 ng/dL (ref 0.82–1.77)

## 2019-06-22 LAB — TSH: TSH: 0.022 u[IU]/mL — ABNORMAL LOW (ref 0.450–4.500)

## 2019-06-22 LAB — CALCITRIOL (1,25 DI-OH VIT D): Vit D, 1,25-Dihydroxy: 49.5 pg/mL (ref 19.9–79.3)

## 2019-06-25 ENCOUNTER — Encounter: Payer: Self-pay | Admitting: "Endocrinology

## 2019-06-25 ENCOUNTER — Ambulatory Visit (INDEPENDENT_AMBULATORY_CARE_PROVIDER_SITE_OTHER): Payer: Commercial Managed Care - PPO | Admitting: "Endocrinology

## 2019-06-25 DIAGNOSIS — E063 Autoimmune thyroiditis: Secondary | ICD-10-CM

## 2019-06-25 DIAGNOSIS — E038 Other specified hypothyroidism: Secondary | ICD-10-CM

## 2019-06-25 MED ORDER — LEVOTHYROXINE SODIUM 88 MCG PO TABS: 88.0000 ug | ORAL_TABLET | Freq: Every day | ORAL | 1 refills | Status: DC

## 2019-06-25 NOTE — Progress Notes (Signed)
06/25/2019, 6:28 PM                                Endocrinology Telehealth Visit Follow up Note -During COVID -19 Pandemic  I connected with Kristen Hayes on 06/25/2019   by telephone and verified that I am speaking with the correct person using two identifiers. Kristen Hayes, 1970/06/20. she has verbally consented to this visit. All issues noted in this document were discussed and addressed. The format was not optimal for physical exam.   Kristen Hayes is a 49 y.o.-year-old female patient being engaged in telehealth via telephone after she was seen in consultation for hypothyroidism. PMD  Caryl Bis, MD.   Past Medical History:  Diagnosis Date  . Constipation   . Cough 01/15/2015  . Hemorrhoids 02/12/2013  . Hypothyroid 02/12/2013  . Nasal congestion 05/26/2015  . Otitis media of right ear 01/15/2015  . Sore throat 01/15/2015  . Thyroid disease   . Vaginal discharge 09/10/2013    Past Surgical History:  Procedure Laterality Date  . BAND HEMORRHOIDECTOMY      Social History   Socioeconomic History  . Marital status: Married    Spouse name: Not on file  . Number of children: Not on file  . Years of education: Not on file  . Highest education level: Not on file  Occupational History  . Not on file  Tobacco Use  . Smoking status: Never Smoker  . Smokeless tobacco: Never Used  Substance and Sexual Activity  . Alcohol use: No  . Drug use: No  . Sexual activity: Yes    Birth control/protection: Other-see comments    Comment: vasectomy  Other Topics Concern  . Not on file  Social History Narrative  . Not on file   Social Determinants of Health   Financial Resource Strain:   . Difficulty of Paying Living Expenses: Not on file  Food Insecurity:   . Worried About Charity fundraiser in the Last Year: Not on file  . Ran Out of Food in the Last Year: Not on file   Transportation Needs:   . Lack of Transportation (Medical): Not on file  . Lack of Transportation (Non-Medical): Not on file  Physical Activity:   . Days of Exercise per Week: Not on file  . Minutes of Exercise per Session: Not on file  Stress:   . Feeling of Stress : Not on file  Social Connections:   . Frequency of Communication with Friends and Family: Not on file  . Frequency of Social Gatherings with Friends and Family: Not on file  . Attends Religious Services: Not on file  . Active Member of Clubs or Organizations: Not on file  . Attends Archivist Meetings: Not on file  .  Marital Status: Not on file    Family History  Problem Relation Age of Onset  . Heart disease Mother        heart attack  . Thyroid disease Mother   . Hypertension Mother   . Hyperlipidemia Mother   . Heart attack Mother   . Diabetes Father   . Heart disease Father   . Hypertension Father   . Hyperlipidemia Father   . Heart attack Father   . Bowel Disease Brother        Crohn's    Outpatient Encounter Medications as of 06/25/2019  Medication Sig  . cetirizine (ZYRTEC) 10 MG tablet Take 10 mg by mouth daily.  . Famotidine (PEPCID AC PO) Take 1 tablet by mouth daily.  . hydrocortisone-pramoxine (ANALPRAM-HC) 2.5-1 % rectal cream PLACE 1 APPLICATION RECTALLY 3 TIMES DAILY. (Patient not taking: Reported on 04/15/2019)  . levothyroxine (SYNTHROID) 88 MCG tablet Take 1 tablet (88 mcg total) by mouth daily before breakfast.  . [DISCONTINUED] levothyroxine (SYNTHROID) 100 MCG tablet Take 1 tablet (100 mcg total) by mouth daily.   No facility-administered encounter medications on file as of 06/25/2019.    ALLERGIES: No Known Allergies VACCINATION STATUS: Immunization History  Administered Date(s) Administered  . Influenza-Unspecified 12/30/2013     HPI    Kristen Hayes  is a patient with the above medical history. she was diagnosed  with hypothyroidism at approximate age of 49 years  with which required subsequnt initiation of thyroid hormone replacement. she was given various doses of levothyroxine over the years, currently on levothyroxine 100 micrograms.   she reports compliance to this medication: Her recent thyroid function tests are consistent with over replacement.  Her recent labs show evidence of Hashimoto's thyroiditis.  -She describes stable weight. -She denies palpitations, heat intolerance. -She denies dysphagia, shortness of breath, voice change.  She has family history of what appears to be hypothyroidism in her mother. -She is a mother of 2 grown children and has grand children.  Pt denies feeling nodules in neck, hoarseness,  she denies any family history of thyroid malignancy.   No history of thyroidectomy, nor radiation therapy to head or neck.   ROS: Limited as above.  Physical Exam: There were no vitals taken for this visit. Wt Readings from Last 3 Encounters:  06/19/19 153 lb (69.4 kg)  04/15/19 148 lb (67.1 kg)  11/04/18 148 lb (67.1 kg)      CMP ( most recent) CMP     Component Value Date/Time   NA 141 09/04/2017 0853   K 4.3 09/04/2017 0853   CL 104 09/04/2017 0853   CO2 24 09/04/2017 0853   GLUCOSE 99 09/04/2017 0853   GLUCOSE 86 02/27/2014 0906   BUN 11 09/04/2017 0853   CREATININE 0.82 09/04/2017 0853   CREATININE 0.71 02/27/2014 0906   CALCIUM 9.4 09/04/2017 0853   PROT 6.7 09/04/2017 0853   ALBUMIN 4.4 09/04/2017 0853   AST 21 09/04/2017 0853   ALT 13 09/04/2017 0853   ALKPHOS 58 09/04/2017 0853   BILITOT 0.4 09/04/2017 0853   GFRNONAA 86 09/04/2017 0853   GFRAA 99 09/04/2017 0853     Lipid Panel ( most recent) Lipid Panel     Component Value Date/Time   CHOL 147 09/04/2017 0853   TRIG 47 09/04/2017 0853   HDL 46 09/04/2017 0853   CHOLHDL 3.2 09/04/2017 0853   CHOLHDL 3.3 02/27/2014 0906   VLDL 13 02/27/2014 0906   LDLCALC 92 09/04/2017 0853  LABVLDL 9 09/04/2017 0853     Lab Results  Component  Value Date   TSH 0.022 (L) 06/19/2019   TSH 0.053 (L) 11/06/2018   TSH 0.953 09/04/2017   TSH 0.425 (L) 08/16/2016   TSH 0.678 05/04/2015   TSH 0.565 02/27/2014   TSH 0.951 10/31/2012   FREET4 1.73 06/19/2019   FREET4 1.58 11/06/2018     ASSESSMENT: 1. Hypothyroidism 2.  Hashimoto's thyroiditis   PLAN:  Her previsit labs are consistent with over replacement with levothyroxine.  I discussed and lowered her levothyroxine to 88 mcg p.o. daily before breakfast.  - We discussed about the correct intake of her thyroid hormone, on empty stomach at fasting, with water, separated by at least 30 minutes from breakfast and other medications,  and separated by more than 4 hours from calcium, iron, multivitamins, acid reflux medications (PPIs). -Patient is made aware of the fact that thyroid hormone replacement is needed for life, dose to be adjusted by periodic monitoring of thyroid function tests.  Her labs are negative for celiac disease, reassured that she can consume gluten.  -Due to absence of clinical goiter, no need for thyroid ultrasound.    - Time spent on this patient care encounter:  20  minutes of which 50% was spent in  counseling and the rest reviewing  her current and  previous labs / studies and medications  doses and developing a plan for long term care. Rebbeca Gustavo Lah  participated in the discussions, expressed understanding, and voiced agreement with the above plans.  All questions were answered to her satisfaction. she is encouraged to contact clinic should she have any questions or concerns prior to her return visit.   Return in about 4 months (around 10/25/2019) for Follow up with Pre-visit Labs.  Glade Lloyd, MD [facility] [address] [place], [place] [address] Phone: [phone]  Fax: [phone]   06/25/2019, 6:28 PM  This note was partially dictated with voice recognition software. Similar sounding words can  be transcribed inadequately or may not  be corrected upon review.

## 2019-06-30 ENCOUNTER — Other Ambulatory Visit (HOSPITAL_COMMUNITY): Payer: Self-pay | Admitting: Adult Health

## 2019-06-30 DIAGNOSIS — R921 Mammographic calcification found on diagnostic imaging of breast: Secondary | ICD-10-CM

## 2019-07-01 ENCOUNTER — Ambulatory Visit: Payer: No Typology Code available for payment source | Admitting: "Endocrinology

## 2019-07-22 ENCOUNTER — Ambulatory Visit (HOSPITAL_COMMUNITY): Payer: Commercial Managed Care - PPO

## 2019-07-22 ENCOUNTER — Encounter (HOSPITAL_COMMUNITY): Payer: Self-pay

## 2019-09-18 ENCOUNTER — Other Ambulatory Visit: Payer: Self-pay | Admitting: "Endocrinology

## 2019-09-18 DIAGNOSIS — E038 Other specified hypothyroidism: Secondary | ICD-10-CM

## 2019-09-18 MED ORDER — LEVOTHYROXINE SODIUM 88 MCG PO TABS: 88.0000 ug | ORAL_TABLET | Freq: Every day | ORAL | 1 refills | Status: DC

## 2019-09-18 MED FILL — LEVOTHYROXINE 88 MCG TABLET: 88 | 30 days supply | Qty: 30 | Fill #0

## 2019-09-22 ENCOUNTER — Other Ambulatory Visit: Payer: Self-pay | Admitting: "Endocrinology

## 2019-09-22 ENCOUNTER — Telehealth: Payer: Self-pay | Admitting: "Endocrinology

## 2019-09-22 DIAGNOSIS — E038 Other specified hypothyroidism: Secondary | ICD-10-CM

## 2019-09-22 NOTE — Telephone Encounter (Signed)
Patient is calling and states she is needing her lab orders to be placed for [facility] instead of [facility]

## 2019-09-22 NOTE — Telephone Encounter (Signed)
Orders placed for [facility].   

## 2019-10-27 ENCOUNTER — Ambulatory Visit: Payer: No Typology Code available for payment source | Admitting: "Endocrinology

## 2019-10-28 ENCOUNTER — Other Ambulatory Visit: Payer: Self-pay | Admitting: Adult Health

## 2019-10-28 DIAGNOSIS — Z01419 Encounter for gynecological examination (general) (routine) without abnormal findings: Secondary | ICD-10-CM

## 2019-10-28 NOTE — Progress Notes (Signed)
Check CBC,CMP and lipids, Dr Dorris Fetch checks thyroid

## 2019-11-01 LAB — LIPID PANEL
Chol/HDL Ratio: 3.3 ratio (ref 0.0–4.4)
Cholesterol, Total: 143 mg/dL (ref 100–199)
HDL: 43 mg/dL (ref >39)
LDL Chol Calc (NIH): 85 mg/dL (ref 0–99)
Triglycerides: 79 mg/dL (ref 0–149)
VLDL Cholesterol Cal: 15 mg/dL (ref 5–40)

## 2019-11-01 LAB — COMPREHENSIVE METABOLIC PANEL
ALT: 15 IU/L (ref 0–32)
AST: 24 IU/L (ref 0–40)
Albumin/Globulin Ratio: 1.7 (ref 1.2–2.2)
Albumin: 4.2 g/dL (ref 3.8–4.8)
Alkaline Phosphatase: 65 IU/L (ref 48–121)
BUN/Creatinine Ratio: 16 (ref 9–23)
BUN: 12 mg/dL (ref 6–24)
Bilirubin Total: 0.4 mg/dL (ref 0.0–1.2)
CO2: 24 mmol/L (ref 20–29)
Calcium: 9.4 mg/dL (ref 8.7–10.2)
Chloride: 103 mmol/L (ref 96–106)
Creatinine, Ser: 0.76 mg/dL (ref 0.57–1.00)
GFR calc Af Amer: 107 mL/min/{1.73_m2} (ref >59)
GFR calc non Af Amer: 93 mL/min/{1.73_m2} (ref >59)
Globulin, Total: 2.5 g/dL (ref 1.5–4.5)
Glucose: 87 mg/dL (ref 65–99)
Potassium: 4.4 mmol/L (ref 3.5–5.2)
Sodium: 138 mmol/L (ref 134–144)
Total Protein: 6.7 g/dL (ref 6.0–8.5)

## 2019-11-01 LAB — CBC
Hematocrit: 41.5 % (ref 34.0–46.6)
Hemoglobin: 13.7 g/dL (ref 11.1–15.9)
MCH: 30.2 pg (ref 26.6–33.0)
MCHC: 33 g/dL (ref 31.5–35.7)
MCV: 91 fL (ref 79–97)
Platelets: 258 10*3/uL (ref 150–450)
RBC: 4.54 x10E6/uL (ref 3.77–5.28)
RDW: 13.1 % (ref 11.7–15.4)
WBC: 4.6 10*3/uL (ref 3.4–10.8)

## 2019-11-01 LAB — T4, FREE: Free T4: 1.78 ng/dL — ABNORMAL HIGH (ref 0.82–1.77)

## 2019-11-01 LAB — TSH: TSH: 0.056 u[IU]/mL — ABNORMAL LOW (ref 0.450–4.500)

## 2019-11-13 ENCOUNTER — Ambulatory Visit: Payer: Commercial Managed Care - PPO | Admitting: "Endocrinology

## 2019-11-27 ENCOUNTER — Encounter: Payer: Self-pay | Admitting: "Endocrinology

## 2019-11-27 ENCOUNTER — Telehealth: Payer: Commercial Managed Care - PPO | Admitting: "Endocrinology

## 2019-11-27 ENCOUNTER — Other Ambulatory Visit: Payer: Self-pay

## 2019-11-27 ENCOUNTER — Telehealth (INDEPENDENT_AMBULATORY_CARE_PROVIDER_SITE_OTHER): Payer: Commercial Managed Care - PPO | Admitting: "Endocrinology

## 2019-11-27 VITALS — Ht 63.0 in | Wt 158.0 lb

## 2019-11-27 DIAGNOSIS — E063 Autoimmune thyroiditis: Secondary | ICD-10-CM

## 2019-11-27 DIAGNOSIS — E038 Other specified hypothyroidism: Secondary | ICD-10-CM | POA: Diagnosis not present

## 2019-11-27 MED ORDER — LEVOTHYROXINE SODIUM 75 MCG PO TABS: 75.0000 ug | ORAL_TABLET | Freq: Every day | ORAL | 1 refills | Status: DC

## 2019-11-27 NOTE — Progress Notes (Signed)
Pt states her " tongue feels weird", feels thick and experiencing a burning sensation since lowering her dose of levothyroxine.

## 2019-11-27 NOTE — Progress Notes (Signed)
11/27/2019, 2:36 PM                                Endocrinology Telehealth Visit Follow up Note -During COVID -19 Pandemic  I connected with Kristen Hayes on 11/27/2019  by video and verified that I am speaking with the correct person using two identifiers. Kristen Hayes, 07-14-1970. she has verbally consented to this visit. All issues noted in this document were discussed and addressed. The format was not optimal for physical exam.   Kristen Hayes is a 49 y.o.-year-old female patient being engaged in telehealth via video after she was seen in consultation for hypothyroidism. PMD  Caryl Bis, MD.   Past Medical History:  Diagnosis Date  . Constipation   . Cough 01/15/2015  . Hemorrhoids 02/12/2013  . Hypothyroid 02/12/2013  . Nasal congestion 05/26/2015  . Otitis media of right ear 01/15/2015  . Sore throat 01/15/2015  . Thyroid disease   . Vaginal discharge 09/10/2013    Past Surgical History:  Procedure Laterality Date  . BAND HEMORRHOIDECTOMY      Social History   Socioeconomic History  . Marital status: Married    Spouse name: Not on file  . Number of children: Not on file  . Years of education: Not on file  . Highest education level: Not on file  Occupational History  . Not on file  Tobacco Use  . Smoking status: Never Smoker  . Smokeless tobacco: Never Used  Substance and Sexual Activity  . Alcohol use: No  . Drug use: No  . Sexual activity: Yes    Birth control/protection: Other-see comments    Comment: vasectomy  Other Topics Concern  . Not on file  Social History Narrative  . Not on file   Social Determinants of Health   Financial Resource Strain:   . Difficulty of Paying Living Expenses:   Food Insecurity:   . Worried About Charity fundraiser in the Last Year:   . Arboriculturist in the Last Year:   Transportation Needs:   . Lexicographer (Medical):   Marland Kitchen Lack of Transportation (Non-Medical):   Physical Activity:   . Days of Exercise per Week:   . Minutes of Exercise per Session:   Stress:   . Feeling of Stress :   Social Connections:   . Frequency of Communication with Friends and Family:   . Frequency of Social Gatherings with Friends and Family:   . Attends Religious Services:   . Active Member of Clubs or Organizations:   . Attends Archivist Meetings:   Marland Kitchen Marital Status:     Family History  Problem Relation Age of Onset  . Heart disease Mother  heart attack  . Thyroid disease Mother   . Hypertension Mother   . Hyperlipidemia Mother   . Heart attack Mother   . Diabetes Father   . Heart disease Father   . Hypertension Father   . Hyperlipidemia Father   . Heart attack Father   . Bowel Disease Brother        Crohn's    Outpatient Encounter Medications as of 11/27/2019  Medication Sig  . cetirizine (ZYRTEC) 10 MG tablet Take 10 mg by mouth daily.  . Famotidine (PEPCID AC PO) Take 1 tablet by mouth daily.  . hydrocortisone-pramoxine (ANALPRAM-HC) 2.5-1 % rectal cream PLACE 1 APPLICATION RECTALLY 3 TIMES DAILY. (Patient not taking: Reported on 04/15/2019)  . levothyroxine (SYNTHROID) 75 MCG tablet Take 1 tablet (75 mcg total) by mouth daily before breakfast.  . [DISCONTINUED] levothyroxine (SYNTHROID) 88 MCG tablet Take 1 tablet (88 mcg total) by mouth daily before breakfast.   No facility-administered encounter medications on file as of 11/27/2019.    ALLERGIES: No Known Allergies VACCINATION STATUS: Immunization History  Administered Date(s) Administered  . Influenza-Unspecified 12/30/2013  . Moderna SARS-COVID-2 Vaccination 11/17/2019     HPI    Kristen Hayes  is a patient with the above medical history. she was diagnosed  with hypothyroidism at approximate age of 13 years with which required subsequnt initiation of thyroid hormone replacement. she was given various  doses of levothyroxine over the years, currently on levothyroxine 88 micrograms.   she reports compliance to this medication: Her recent thyroid function tests are consistent with over replacement.  Her recent labs show evidence of Hashimoto's thyroiditis.  -She reports 5 pound weight gain since last visit.   -She denies palpitations, heat intolerance. -She denies dysphagia, shortness of breath, voice change.  She has family history of what appears to be hypothyroidism in her mother. -She is a mother of 2 grown children and has grand children.  Pt denies feeling nodules in neck, hoarseness,  she denies any family history of thyroid malignancy.   No history of thyroidectomy, nor radiation therapy to head or neck.   ROS: Limited as above.  Physical Exam: Ht 5\' 3"  (1.6 m)   Wt 158 lb (71.7 kg)   BMI 27.99 kg/m  Wt Readings from Last 3 Encounters:  11/27/19 158 lb (71.7 kg)  06/19/19 153 lb (69.4 kg)  04/15/19 148 lb (67.1 kg)      CMP ( most recent) CMP     Component Value Date/Time   NA 138 10/31/2019 0842   K 4.4 10/31/2019 0842   CL 103 10/31/2019 0842   CO2 24 10/31/2019 0842   GLUCOSE 87 10/31/2019 0842   GLUCOSE 86 02/27/2014 0906   BUN 12 10/31/2019 0842   CREATININE 0.76 10/31/2019 0842   CREATININE 0.71 02/27/2014 0906   CALCIUM 9.4 10/31/2019 0842   PROT 6.7 10/31/2019 0842   ALBUMIN 4.2 10/31/2019 0842   AST 24 10/31/2019 0842   ALT 15 10/31/2019 0842   ALKPHOS 65 10/31/2019 0842   BILITOT 0.4 10/31/2019 0842   GFRNONAA 93 10/31/2019 0842   GFRAA 107 10/31/2019 0842     Lipid Panel ( most recent) Lipid Panel     Component Value Date/Time   CHOL 143 10/31/2019 0842   TRIG 79 10/31/2019 0842   HDL 43 10/31/2019 0842   CHOLHDL 3.3 10/31/2019 0842   CHOLHDL 3.3 02/27/2014 0906   VLDL 13 02/27/2014 0906   LDLCALC 85 10/31/2019 0842   LABVLDL 15  10/31/2019 0842     Lab Results  Component Value Date   TSH 0.056 (L) 10/31/2019   TSH 0.022 (L)  06/19/2019   TSH 0.053 (L) 11/06/2018   TSH 0.953 09/04/2017   TSH 0.425 (L) 08/16/2016   TSH 0.678 05/04/2015   TSH 0.565 02/27/2014   TSH 0.951 10/31/2012   FREET4 1.78 (H) 10/31/2019   FREET4 1.73 06/19/2019   FREET4 1.58 11/06/2018     ASSESSMENT: 1. Hypothyroidism 2.  Hashimoto's thyroiditis   PLAN:  Her previsit labs are consistent with over replacement with levothyroxine.  I discussed and lowered her levothyroxine to 75 mcg p.o. daily before breakfast.    - We discussed about the correct intake of her thyroid hormone, on empty stomach at fasting, with water, separated by at least 30 minutes from breakfast and other medications,  and separated by more than 4 hours from calcium, iron, multivitamins, acid reflux medications (PPIs). -Patient is made aware of the fact that thyroid hormone replacement is needed for life, dose to be adjusted by periodic monitoring of thyroid function tests.  Her labs are negative for celiac disease, reassured that she can consume gluten.  -Due to absence of clinical goiter, no need for thyroid ultrasound.         - Time spent on this patient care encounter:  20 minutes of which 50% was spent in  counseling and the rest reviewing  her current and  previous labs / studies and medications  doses and developing a plan for long term care, and documenting her care. Eddis Gustavo Hayes  participated in the discussions, expressed understanding, and voiced agreement with the above plans.  All questions were answered to her satisfaction. she is encouraged to contact clinic should she have any questions or concerns prior to her return visit.   Return in about 4 months (around 03/28/2020) for F/U with Pre-visit Labs.  Glade Lloyd, MD [facility] [address] [place], [place] [address] Phone: [phone]  Fax: [phone]   11/27/2019, 2:36 PM  This note was partially dictated with voice recognition  software. Similar sounding words can be transcribed inadequately or may not  be corrected upon review.

## 2019-12-04 ENCOUNTER — Encounter: Payer: Self-pay | Admitting: Adult Health

## 2019-12-04 ENCOUNTER — Ambulatory Visit (INDEPENDENT_AMBULATORY_CARE_PROVIDER_SITE_OTHER): Payer: Commercial Managed Care - PPO | Admitting: Adult Health

## 2019-12-04 ENCOUNTER — Other Ambulatory Visit: Payer: Self-pay

## 2019-12-04 VITALS — BP 107/68 | HR 79 | Ht 63.0 in | Wt 161.0 lb

## 2019-12-04 DIAGNOSIS — Z01419 Encounter for gynecological examination (general) (routine) without abnormal findings: Secondary | ICD-10-CM | POA: Diagnosis not present

## 2019-12-04 MED ORDER — METRONIDAZOLE 0.75 % VA GEL: 1.0000 | Freq: Every day | VAGINAL | 0 refills | Status: DC

## 2019-12-04 NOTE — Progress Notes (Signed)
Patient ID: Kristen Hayes, female   DOB: Apr 13, 1971, 49 y.o.   MRN: [id number] History of Present Illness:  Kristen Hayes is a 49 year old white female, married, G2P2 in for a well woman gyn exam. She had a normal pap with negative HPV 11/04/2018. She sees Dr Dorris Fetch for thyroid.  PCP is Dr Quillian Quince.   Current Medications, Allergies, Past Medical History, Past Surgical History, Family History and Social History were reviewed in [employer] record.     Review of Systems: Patient denies any headaches, hearing loss, fatigue, blurred vision,chest pain, abdominal pain, problems with bowel movements, urination, or intercourse. No joint pain or mood swings. Periods are regular Has gained some weight Has shortness of breath at times Has odor right before period    Physical Exam:BP 107/68 (BP Location: Right Arm, Patient Position: Sitting, Cuff Size: Normal)   Pulse 79   Ht 5\' 3"  (1.6 m)   Wt 161 lb (73 kg)   LMP 12/01/2019 (Exact Date)   BMI 28.52 kg/m  General:  Well developed, well nourished, no acute distress Skin:  Warm and dry Neck:  Midline trachea, normal thyroid, good ROM, no lymphadenopathy Lungs; Clear to auscultation bilaterally Breast:  No dominant palpable mass, retraction, or nipple discharge Cardiovascular: Regular rate and rhythm Abdomen:  Soft, non tender, no hepatosplenomegaly Pelvic:  External genitalia is normal in appearance, no lesions.  The vagina is normal in appearance, with period like blood. Urethra has no lesions or masses. The cervix is bulbous.  Uterus is felt to be normal size, shape, and contour.  No adnexal masses or tenderness noted.Bladder is non tender, no masses felt. Rectal: Good sphincter tone, no polyps, + hemorrhoids felt. Will send 3 hemoccult cards home since on period  Extremities/musculoskeletal:  No swelling or varicosities noted, no clubbing or cyanosis Psych:  No mood changes, alert and cooperative,seems happy AA is 0 Fall risk is  low PHQ 9 score is 0  Upstream - 12/04/19 1542      Pregnancy Intention Screening   Does the patient want to become pregnant in the next year? No    Does the patient's partner want to become pregnant in the next year? No    Would the patient like to discuss contraceptive options today? No      Contraception Wrap Up   Current Method Vasectomy    End Method Vasectomy    Contraception Counseling Provided No          Kristen Hayes gave verbal  permission for exam with out chaperone.    Impression and Plan: 1. Encounter for well woman exam with routine gynecological exam Physical in 1 year Pap in 2023 Mammogram yearly Given 3 hemoccult cards to do and bring back Will Rx Metrogel to use before period if notices odor Follow up with Dr Dorris Fetch as scheduled

## 2020-01-08 ENCOUNTER — Other Ambulatory Visit (HOSPITAL_COMMUNITY): Payer: Self-pay | Admitting: Adult Health

## 2020-01-08 DIAGNOSIS — Z1231 Encounter for screening mammogram for malignant neoplasm of breast: Secondary | ICD-10-CM

## 2020-02-16 ENCOUNTER — Other Ambulatory Visit: Payer: Self-pay | Admitting: "Endocrinology

## 2020-02-16 ENCOUNTER — Other Ambulatory Visit: Payer: Self-pay

## 2020-02-16 ENCOUNTER — Telehealth: Payer: Self-pay | Admitting: "Endocrinology

## 2020-02-16 DIAGNOSIS — E038 Other specified hypothyroidism: Secondary | ICD-10-CM

## 2020-02-16 MED ORDER — LEVOTHYROXINE SODIUM 75 MCG PO TABS: 75.0000 ug | ORAL_TABLET | Freq: Every day | ORAL | 1 refills | Status: DC

## 2020-02-16 NOTE — Telephone Encounter (Signed)
Patient called and sees Dr. Dorris Fetch on 12/13. She said that a [place] of thyroid was mentioned and she would like to get that done before her next appt. Patient also states she needs a refill on her levothyroxine (SYNTHROID) 75 MCG tablet  She needs a 90 day supply.  [place], [place] Phone:  [phone]  Fax:  [phone]

## 2020-02-16 NOTE — Telephone Encounter (Signed)
Please advise about U/S, I do not see an order for it. I believe you weren't going to order one.

## 2020-02-16 NOTE — Telephone Encounter (Signed)
Ok. I will refill and order her thyroid ultrasound.

## 2020-03-10 ENCOUNTER — Encounter: Payer: Self-pay | Admitting: Adult Health

## 2020-03-20 LAB — T4, FREE: Free T4: 1.37 ng/dL (ref 0.82–1.77)

## 2020-03-20 LAB — TSH: TSH: 0.545 u[IU]/mL (ref 0.450–4.500)

## 2020-03-29 ENCOUNTER — Ambulatory Visit: Payer: Commercial Managed Care - PPO | Admitting: "Endocrinology

## 2020-04-04 IMAGING — MG MM DIGITAL DIAGNOSTIC UNILAT*R* W/ TOMO W/ CAD
5 series · 6 of 9 positions shown · non-contrast
Comparison: Previous exam(s).

CLINICAL DATA: Right breast calcifications seen on most recent
screening mammography.

EXAM:
DIGITAL DIAGNOSTIC UNILATERAL RIGHT MAMMOGRAM WITH CAD AND TOMO

[R CC (1 of 2)]
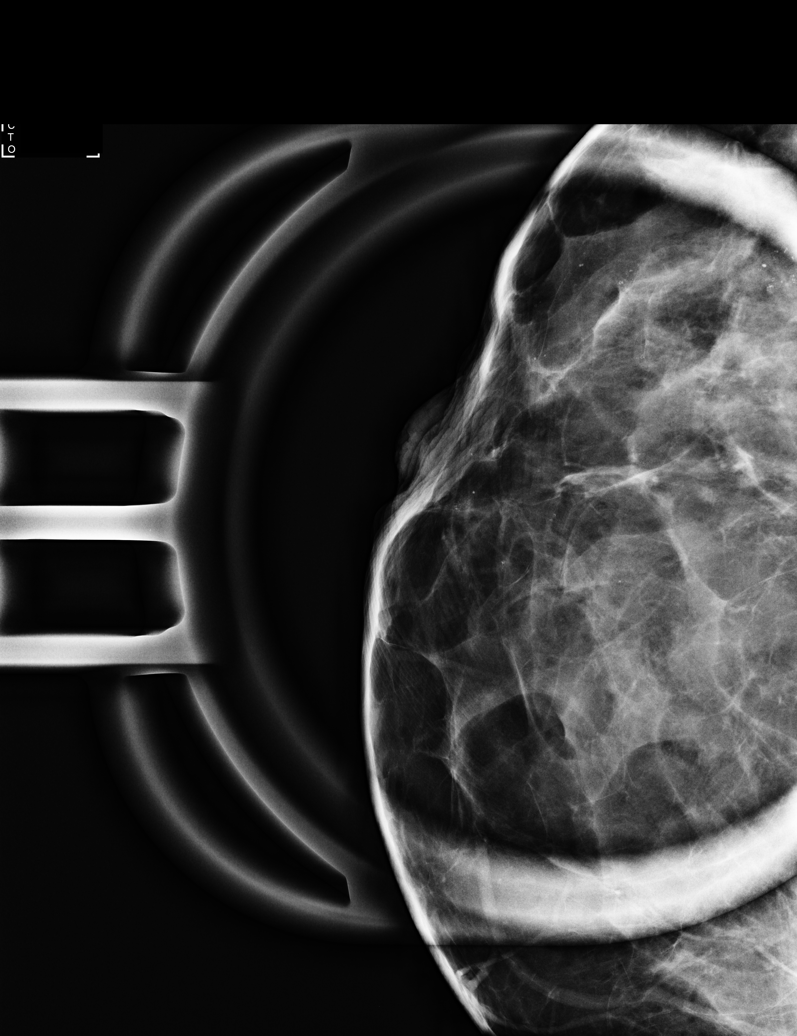

[R CC (2 of 2)]
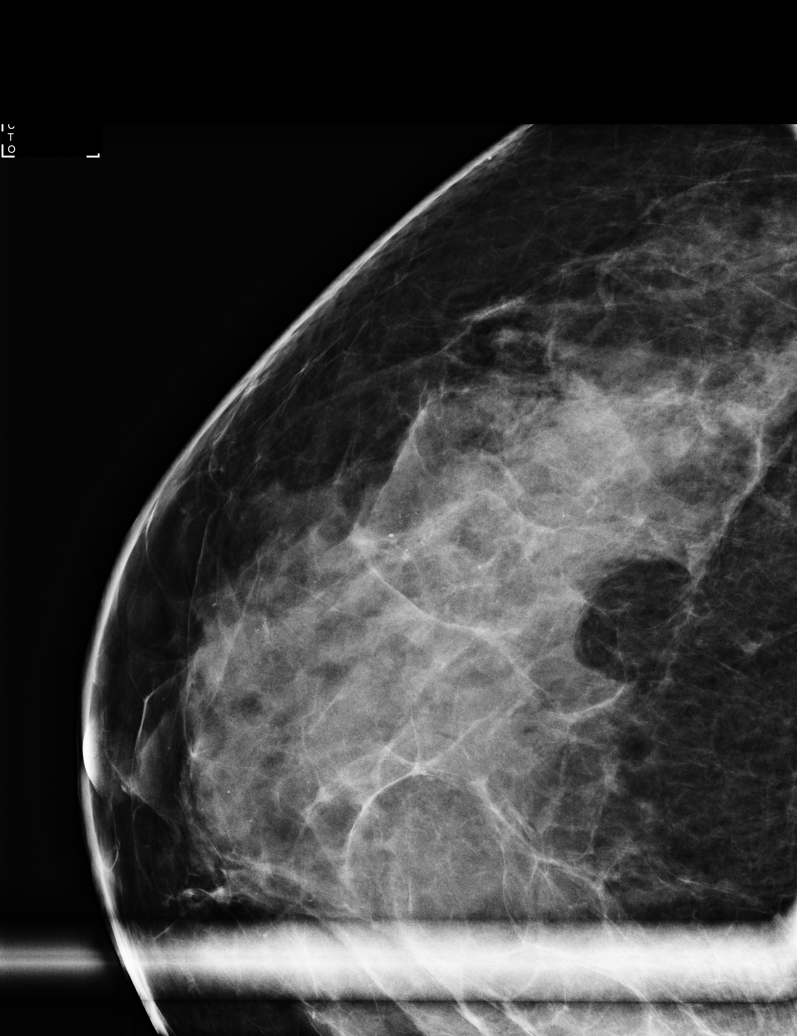

[R ML]
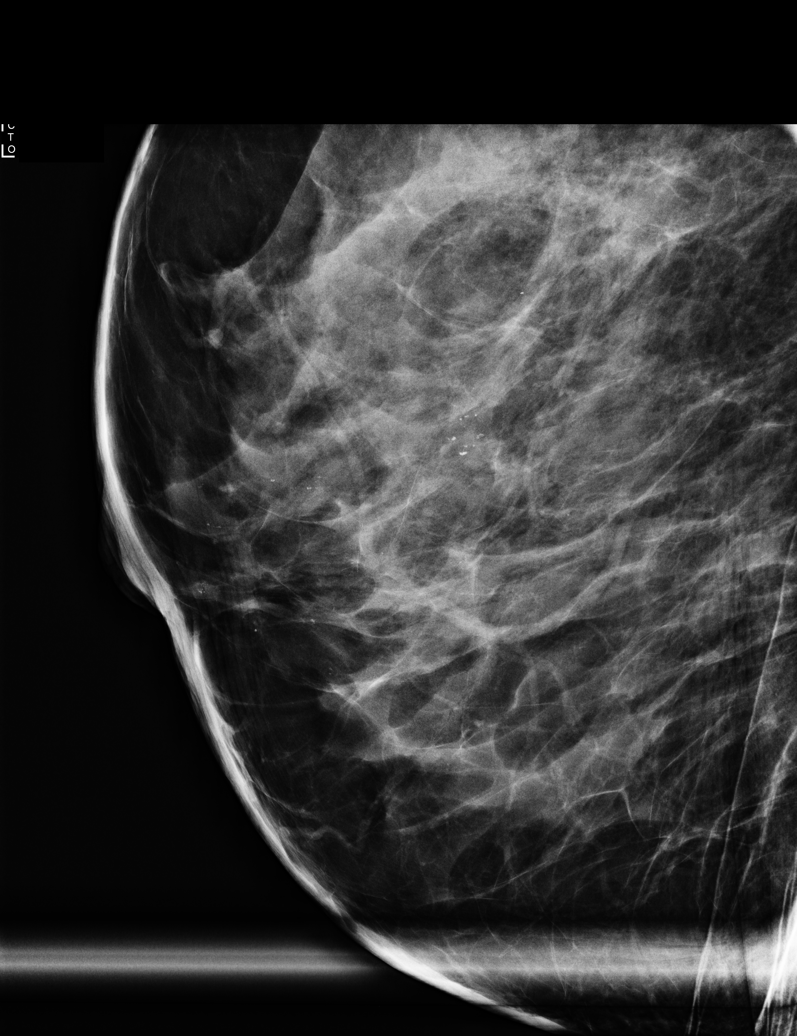

[R ML synth-2D]
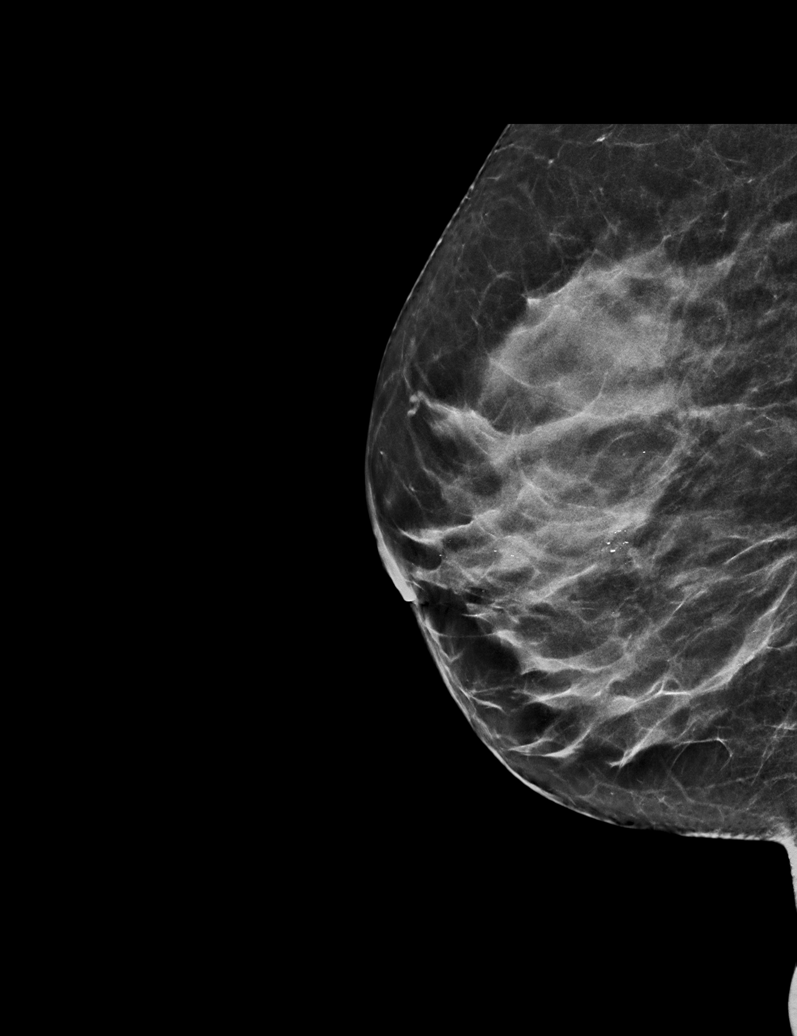

[R ML tomo · 2 of 59 frames shown]
[frame 20/59]
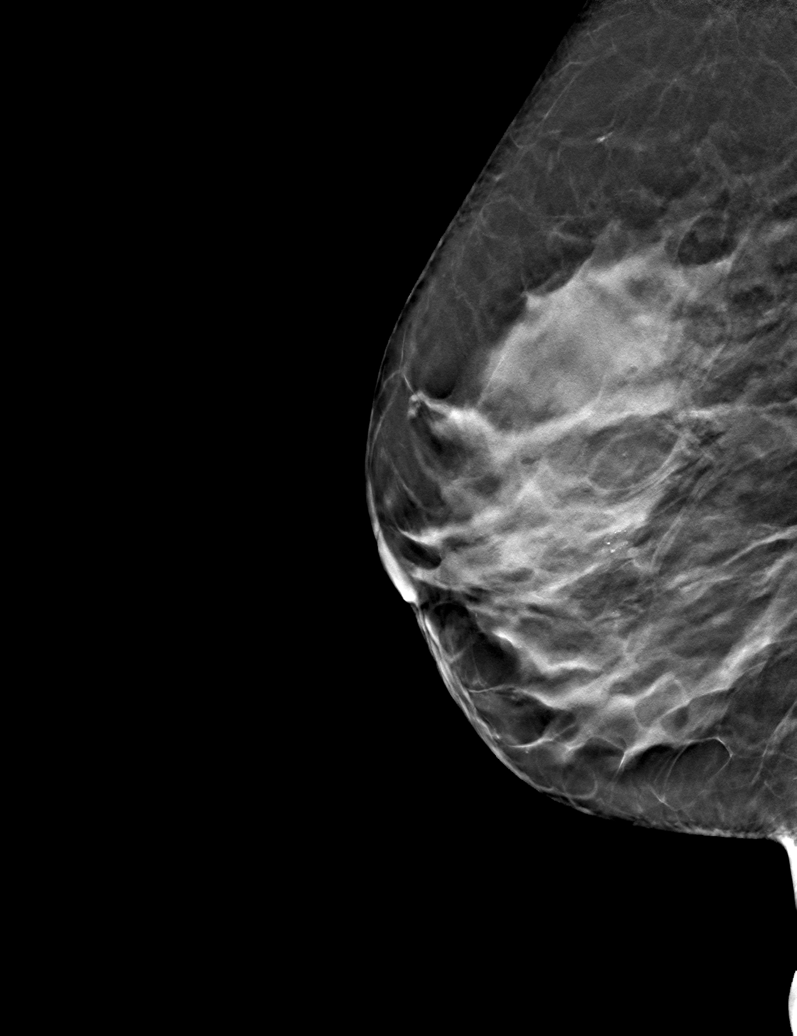
[frame 30/59]
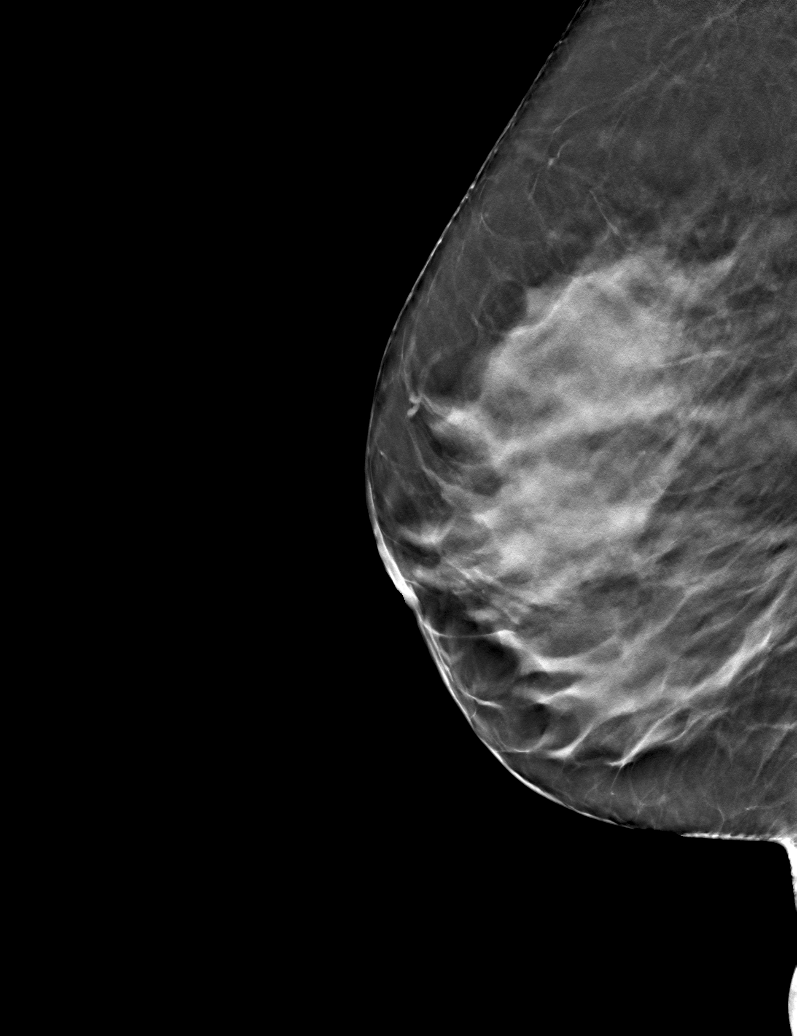

[6 of 9 positions shown; findings below may reference images not displayed]

ACR Breast Density Category c: The breast tissue is heterogeneously
dense, which may obscure small masses.
FINDINGS: Additional mammographic views of the right breast demonstrate a
cluster of probably benign calcifications, some of which demonstrate
layering, in the right breast slightly upper outer quadrant,
anterior depth, measuring 6 mm. A second smaller cluster of probably
benign calcifications is seen in the immediate subareolar right
breast, slightly lower inner quadrant, and measures 3 mm. This small
cluster may have been present on patient's prior mammograms, but is
better seen on today's exam. Additionally, non clustered probably
benign calcifications are seen in the right breast upper outer
quadrant, anterior depth.

Mammographic images were processed with CAD.
IMPRESSION: Right breast probably benign calcifications, for which short-term
follow-up is recommended

RECOMMENDATION:
Diagnostic right breast mammogram in 6 months.

I have discussed the findings and recommendations with the patient.
Results were also provided in writing at the conclusion of the
visit. If applicable, a reminder letter will be sent to the patient
regarding the next appointment.

BI-RADS CATEGORY  3: Probably benign.

## 2020-04-08 ENCOUNTER — Other Ambulatory Visit: Payer: Self-pay

## 2020-04-08 ENCOUNTER — Encounter: Payer: Self-pay | Admitting: "Endocrinology

## 2020-04-08 ENCOUNTER — Ambulatory Visit (INDEPENDENT_AMBULATORY_CARE_PROVIDER_SITE_OTHER): Payer: Commercial Managed Care - PPO | Admitting: "Endocrinology

## 2020-04-08 VITALS — BP 127/80 | HR 70 | Ht 63.0 in | Wt 156.0 lb

## 2020-04-08 DIAGNOSIS — E038 Other specified hypothyroidism: Secondary | ICD-10-CM | POA: Diagnosis not present

## 2020-04-08 DIAGNOSIS — E063 Autoimmune thyroiditis: Secondary | ICD-10-CM

## 2020-04-08 MED ORDER — LEVOTHYROXINE SODIUM 75 MCG PO TABS: 75.0000 ug | ORAL_TABLET | Freq: Every day | ORAL | 1 refills | Status: DC

## 2020-04-08 NOTE — Progress Notes (Signed)
04/08/2020, 12:12 PM      Endocrinology follow-up note    Kristen Hayes is a 49 y.o.-year-old female patient who is returning for follow-up for hypothyroidism due to Hashimoto's thyroiditis.   PMD  Caryl Bis, MD.   Past Medical History:  Diagnosis Date  . Constipation   . Cough 01/15/2015  . Hemorrhoids 02/12/2013  . Hypothyroid 02/12/2013  . Nasal congestion 05/26/2015  . Otitis media of right ear 01/15/2015  . Sore throat 01/15/2015  . Thyroid disease   . Vaginal discharge 09/10/2013    Past Surgical History:  Procedure Laterality Date  . BAND HEMORRHOIDECTOMY      Social History   Socioeconomic History  . Marital status: Married    Spouse name: Not on file  . Number of children: Not on file  . Years of education: Not on file  . Highest education level: Not on file  Occupational History  . Not on file  Tobacco Use  . Smoking status: Never Smoker  . Smokeless tobacco: Never Used  Substance and Sexual Activity  . Alcohol use: No  . Drug use: No  . Sexual activity: Yes    Birth control/protection: Other-see comments    Comment: vasectomy  Other Topics Concern  . Not on file  Social History Narrative  . Not on file   Social Determinants of Health   Financial Resource Strain: Not on file  Food Insecurity: Not on file  Transportation Needs: Not on file  Physical Activity: Not on file  Stress: Not on file  Social Connections: Not on file    Family History  Problem Relation Age of Onset  . Heart disease Mother        heart attack  . Thyroid disease Mother   . Hypertension Mother   . Hyperlipidemia Mother   . Heart attack Mother   . Diabetes Father   . Heart disease Father   . Hypertension Father   . Hyperlipidemia Father   . Heart attack Father   . Bowel Disease Brother        Crohn's    Outpatient Encounter Medications as of  04/08/2020  Medication Sig  . levothyroxine (SYNTHROID) 75 MCG tablet Take 1 tablet (75 mcg total) by mouth daily before breakfast.  . [DISCONTINUED] cetirizine (ZYRTEC) 10 MG tablet Take 10 mg by mouth daily.  . [DISCONTINUED] Famotidine (PEPCID AC PO) Take 1 tablet by mouth daily.  . [DISCONTINUED] hydrocortisone-pramoxine (ANALPRAM-HC) 2.5-1 % rectal cream PLACE 1 APPLICATION RECTALLY 3 TIMES DAILY. (Patient not taking: Reported on 04/15/2019)  . [DISCONTINUED] levothyroxine (SYNTHROID) 75 MCG tablet Take 1 tablet (75 mcg total) by mouth daily before breakfast.  . [DISCONTINUED] metroNIDAZOLE (METROGEL VAGINAL) 0.75 % vaginal gel Place 1 Applicatorful vaginally at bedtime.   No facility-administered encounter medications on file as of 04/08/2020.  ALLERGIES: No Known Allergies VACCINATION STATUS: Immunization History  Administered Date(s) Administered  . Influenza-Unspecified 12/30/2013  . Moderna Sars-Covid-2 Vaccination 11/17/2019, 12/15/2019     HPI    Kristen Hayes  is a patient with the above medical history. she was diagnosed  with hypothyroidism at approximate age of 26 years with which required subsequnt initiation of thyroid hormone replacement. she was given various doses of levothyroxine over the years, currently on levothyroxine 75 micrograms.   she reports compliance to this medication: Her recent thyroid function tests are consistent with appropriate replacement.    Her recent labs show evidence of Hashimoto's thyroiditis.  -She has lost 5 pounds since last visit.  She complains of dry mouth. -She denies palpitations, heat intolerance. -She denies dysphagia, shortness of breath, voice change.  She has family history of what appears to be hypothyroidism in her mother. -She is a mother of 2 grown children and has grand children.  Pt denies feeling nodules in neck, hoarseness.  She recently underwent thyroid ultrasound which did not show any significant nodular  lesions, but bilaterally shrunk lobes.  she denies any family history of thyroid malignancy.   No history of thyroidectomy, nor radiation therapy to head or neck.   ROS: Limited as above.  Physical Exam: BP 127/80   Pulse 70   Ht 5\' 3"  (1.6 m)   Wt 156 lb (70.8 kg)   BMI 27.63 kg/m  Wt Readings from Last 3 Encounters:  04/08/20 156 lb (70.8 kg)  12/04/19 161 lb (73 kg)  11/27/19 158 lb (71.7 kg)      CMP ( most recent) CMP     Component Value Date/Time   NA 138 10/31/2019 0842   K 4.4 10/31/2019 0842   CL 103 10/31/2019 0842   CO2 24 10/31/2019 0842   GLUCOSE 87 10/31/2019 0842   GLUCOSE 86 02/27/2014 0906   BUN 12 10/31/2019 0842   CREATININE 0.76 10/31/2019 0842   CREATININE 0.71 02/27/2014 0906   CALCIUM 9.4 10/31/2019 0842   PROT 6.7 10/31/2019 0842   ALBUMIN 4.2 10/31/2019 0842   AST 24 10/31/2019 0842   ALT 15 10/31/2019 0842   ALKPHOS 65 10/31/2019 0842   BILITOT 0.4 10/31/2019 0842   GFRNONAA 93 10/31/2019 0842   GFRAA 107 10/31/2019 0842     Lipid Panel ( most recent) Lipid Panel     Component Value Date/Time   CHOL 143 10/31/2019 0842   TRIG 79 10/31/2019 0842   HDL 43 10/31/2019 0842   CHOLHDL 3.3 10/31/2019 0842   CHOLHDL 3.3 02/27/2014 0906   VLDL 13 02/27/2014 0906   LDLCALC 85 10/31/2019 0842   LABVLDL 15 10/31/2019 0842     Lab Results  Component Value Date   TSH 0.545 03/19/2020   TSH 0.056 (L) 10/31/2019   TSH 0.022 (L) 06/19/2019   TSH 0.053 (L) 11/06/2018   TSH 0.953 09/04/2017   TSH 0.425 (L) 08/16/2016   TSH 0.678 05/04/2015   TSH 0.565 02/27/2014   TSH 0.951 10/31/2012   FREET4 1.37 03/19/2020   FREET4 1.78 (H) 10/31/2019   FREET4 1.73 06/19/2019   FREET4 1.58 11/06/2018     ASSESSMENT: 1. Hypothyroidism 2.  Hashimoto's thyroiditis   PLAN:  Her previsit labs are consistent with appropriate replacement.  She is advised to continue levothyroxine 75 mcg p.o. daily before breakfast.    - We discussed about the  correct intake of her thyroid hormone, on empty stomach at fasting, with water, separated  by at least 30 minutes from breakfast and other medications,  and separated by more than 4 hours from calcium, iron, multivitamins, acid reflux medications (PPIs). -Patient is made aware of the fact that thyroid hormone replacement is needed for life, dose to be adjusted by periodic monitoring of thyroid function tests.   Her labs are negative for celiac disease, reassured that she can consume gluten. -She is advised to eat with liquids, and use chewing gums to stimulate salivary production.  -Her recent thyroid ultrasound was unremarkable.    She is advised to maintain close follow-up with her PMD Dr. Gar Ponto.      - Time spent on this patient care encounter:  20 minutes of which 50% was spent in  counseling and the rest reviewing  her current and  previous labs / studies and medications  doses and developing a plan for long term care. Elpidia Gustavo Lah  participated in the discussions, expressed understanding, and voiced agreement with the above plans.  All questions were answered to her satisfaction. she is encouraged to contact clinic should she have any questions or concerns prior to her return visit.  Return in about 6 months (around 10/07/2020) for F/U with Pre-visit Labs.  Glade Lloyd, MD [facility] [address] [place], [place] [address] Phone: [phone]  Fax: [phone]   04/08/2020, 12:12 PM  This note was partially dictated with voice recognition software. Similar sounding words can be transcribed inadequately or may not  be corrected upon review.

## 2020-04-15 ENCOUNTER — Ambulatory Visit: Payer: Commercial Managed Care - PPO | Admitting: "Endocrinology

## 2020-09-16 ENCOUNTER — Other Ambulatory Visit: Payer: Self-pay | Admitting: "Endocrinology

## 2020-09-16 DIAGNOSIS — E063 Autoimmune thyroiditis: Secondary | ICD-10-CM

## 2020-09-16 DIAGNOSIS — E038 Other specified hypothyroidism: Secondary | ICD-10-CM

## 2020-10-07 ENCOUNTER — Ambulatory Visit: Payer: Commercial Managed Care - PPO | Admitting: "Endocrinology

## 2020-11-24 ENCOUNTER — Other Ambulatory Visit: Payer: Self-pay | Admitting: "Endocrinology

## 2020-11-24 ENCOUNTER — Telehealth: Payer: Self-pay | Admitting: "Endocrinology

## 2020-11-24 DIAGNOSIS — E038 Other specified hypothyroidism: Secondary | ICD-10-CM

## 2020-11-24 DIAGNOSIS — E063 Autoimmune thyroiditis: Secondary | ICD-10-CM

## 2020-11-24 MED ORDER — LEVOTHYROXINE SODIUM 75 MCG PO TABS: ORAL_TABLET | ORAL | 0 refills | Status: DC

## 2020-11-24 NOTE — Telephone Encounter (Signed)
Pt next appt was resch for 9/23 and is needing a refill on synthroid before.  [place], [place] Phone:  [phone]  Fax:  [phone]

## 2020-11-24 NOTE — Telephone Encounter (Signed)
Rx sent 

## 2020-11-26 ENCOUNTER — Ambulatory Visit: Payer: Commercial Managed Care - PPO | Admitting: "Endocrinology

## 2020-12-03 ENCOUNTER — Other Ambulatory Visit: Payer: Self-pay | Admitting: Adult Health

## 2020-12-03 DIAGNOSIS — Z1321 Encounter for screening for nutritional disorder: Secondary | ICD-10-CM

## 2020-12-03 DIAGNOSIS — Z01419 Encounter for gynecological examination (general) (routine) without abnormal findings: Secondary | ICD-10-CM

## 2020-12-03 NOTE — Progress Notes (Signed)
Check CBC,CMP,vitamin D and lipids

## 2020-12-04 LAB — COMPREHENSIVE METABOLIC PANEL
ALT: 11 IU/L (ref 0–32)
AST: 18 IU/L (ref 0–40)
Albumin/Globulin Ratio: 1.8 (ref 1.2–2.2)
Albumin: 4.2 g/dL (ref 3.8–4.8)
Alkaline Phosphatase: 63 IU/L (ref 44–121)
BUN/Creatinine Ratio: 15 (ref 9–23)
BUN: 13 mg/dL (ref 6–24)
Bilirubin Total: 0.5 mg/dL (ref 0.0–1.2)
CO2: 23 mmol/L (ref 20–29)
Calcium: 9.1 mg/dL (ref 8.7–10.2)
Chloride: 104 mmol/L (ref 96–106)
Creatinine, Ser: 0.89 mg/dL (ref 0.57–1.00)
Globulin, Total: 2.4 g/dL (ref 1.5–4.5)
Glucose: 88 mg/dL (ref 65–99)
Potassium: 4.6 mmol/L (ref 3.5–5.2)
Sodium: 137 mmol/L (ref 134–144)
Total Protein: 6.6 g/dL (ref 6.0–8.5)
eGFR: 79 mL/min/{1.73_m2} (ref >59)

## 2020-12-04 LAB — LIPID PANEL
Chol/HDL Ratio: 3.6 ratio (ref 0.0–4.4)
Cholesterol, Total: 167 mg/dL (ref 100–199)
HDL: 47 mg/dL (ref >39)
LDL Chol Calc (NIH): 107 mg/dL — ABNORMAL HIGH (ref 0–99)
Triglycerides: 65 mg/dL (ref 0–149)
VLDL Cholesterol Cal: 13 mg/dL (ref 5–40)

## 2020-12-04 LAB — CBC
Hematocrit: 43.3 % (ref 34.0–46.6)
Hemoglobin: 13.9 g/dL (ref 11.1–15.9)
MCH: 29.3 pg (ref 26.6–33.0)
MCHC: 32.1 g/dL (ref 31.5–35.7)
MCV: 91 fL (ref 79–97)
Platelets: 268 10*3/uL (ref 150–450)
RBC: 4.75 x10E6/uL (ref 3.77–5.28)
RDW: 12.6 % (ref 11.7–15.4)
WBC: 5.5 10*3/uL (ref 3.4–10.8)

## 2020-12-08 ENCOUNTER — Other Ambulatory Visit: Payer: Commercial Managed Care - PPO | Admitting: Adult Health

## 2020-12-08 LAB — SPECIMEN STATUS REPORT

## 2020-12-08 LAB — VITAMIN D 25 HYDROXY (VIT D DEFICIENCY, FRACTURES): Vit D, 25-Hydroxy: 28 ng/mL — ABNORMAL LOW (ref 30.0–100.0)

## 2020-12-08 LAB — TSH: TSH: 2.73 u[IU]/mL (ref 0.450–4.500)

## 2020-12-08 LAB — T4, FREE: Free T4: 1.47 ng/dL (ref 0.82–1.77)

## 2020-12-09 ENCOUNTER — Other Ambulatory Visit: Payer: Commercial Managed Care - PPO | Admitting: Adult Health

## 2020-12-21 ENCOUNTER — Ambulatory Visit (INDEPENDENT_AMBULATORY_CARE_PROVIDER_SITE_OTHER): Payer: Commercial Managed Care - PPO | Admitting: Adult Health

## 2020-12-21 ENCOUNTER — Other Ambulatory Visit: Payer: Self-pay

## 2020-12-21 ENCOUNTER — Encounter: Payer: Self-pay | Admitting: Adult Health

## 2020-12-21 VITALS — BP 119/78 | HR 70 | Ht 63.0 in | Wt 159.2 lb

## 2020-12-21 DIAGNOSIS — Z1211 Encounter for screening for malignant neoplasm of colon: Secondary | ICD-10-CM | POA: Diagnosis not present

## 2020-12-21 DIAGNOSIS — Z01419 Encounter for gynecological examination (general) (routine) without abnormal findings: Secondary | ICD-10-CM | POA: Diagnosis not present

## 2020-12-21 LAB — HEMOCCULT GUIAC POC 1CARD (OFFICE): Fecal Occult Blood, POC: NEGATIVE

## 2020-12-21 NOTE — Progress Notes (Signed)
Patient ID: Kristen Hayes, female   DOB: 1970-12-06, 50 y.o.   MRN: [id number] History of Present Illness: Dondra is a 50 year old white female,married, G2P2 in for a well woman gyn exam. PCP is Dr Quillian Quince.And she sees Dr Dorris Fetch about thyroid.  Lab Results  Component Value Date   DIAGPAP  11/04/2018    NEGATIVE FOR INTRAEPITHELIAL LESIONS OR MALIGNANCY.   HPV NOT Detected 11/04/2018    Current Medications, Allergies, Past Medical History, Past Surgical History, Family History and Social History were reviewed in [employer] record.     Review of Systems: Patient denies any headaches, hearing loss, fatigue, blurred vision, shortness of breath, chest pain, abdominal pain, problems with bowel movements, urination, or intercourse. No joint pain or mood swings.     Physical Exam:BP 119/78 (BP Location: Right Arm, Patient Position: Sitting, Cuff Size: Normal)   Pulse 70   Ht '5\' 3"'$  (1.6 m)   Wt 159 lb 3.2 oz (72.2 kg)   LMP 12/09/2020 (Exact Date)   BMI 28.20 kg/m   General:  Well developed, well nourished, no acute distress Skin:  Warm and dry Neck:  Midline trachea, normal thyroid, good ROM, no lymphadenopathy Lungs; Clear to auscultation bilaterally Breast:  No dominant palpable mass, retraction, or nipple discharge Cardiovascular: Regular rate and rhythm Abdomen:  Soft, non tender, no hepatosplenomegaly Pelvic:  External genitalia is normal in appearance, no lesions.  The vagina is normal in appearance. Urethra has no lesions or masses. The cervix is bulbous.  Uterus is felt to be normal size, shape, and contour.  No adnexal masses or tenderness noted.Bladder is non tender, no masses felt. Rectal: Good sphincter tone, no polyps, + hemorrhoids felt.  Hemoccult negative. Extremities/musculoskeletal:  No swelling or varicosities noted, no clubbing or cyanosis Psych:  No mood changes, alert and cooperative,seems happy AA is 0  Fall risk is low Depression screen [facility] 2/9  12/21/2020 12/04/2019 11/04/2018  Decreased Interest 0 0 0  Down, Depressed, Hopeless 0 0 0  PHQ - 2 Score 0 0 0  Altered sleeping 0 - -  Tired, decreased energy 0 - -  Change in appetite 0 - -  Feeling bad or failure about yourself  0 - -  Trouble concentrating 0 - -  Moving slowly or fidgety/restless 0 - -  Suicidal thoughts 0 - -  PHQ-9 Score 0 - -    GAD 7 : Generalized Anxiety Score 12/21/2020  Nervous, Anxious, on Edge 0  Control/stop worrying 0  Worry too much - different things 0  Trouble relaxing 0  Restless 0  Easily annoyed or irritable 0  Afraid - awful might happen 0  Total GAD 7 Score 0      [facility] - 12/21/20 1537       Pregnancy Intention Screening   Does the patient want to become pregnant in the next year? No    Does the patient's partner want to become pregnant in the next year? No    Would the patient like to discuss contraceptive options today? No      Contraception Wrap Up   Current Method Vasectomy    End Method Vasectomy    Contraception Counseling Provided No            Examination chaperoned by Celene Squibb LPN   Impression and Plan: 1. Encounter for well woman exam with routine gynecological exam Pap and physical in 1 year Mammogram at [facility] center yearly Colonoscopy or cologuard advised  2. Encounter for screening fecal occult blood testing

## 2020-12-31 ENCOUNTER — Ambulatory Visit: Payer: Commercial Managed Care - PPO | Admitting: "Endocrinology

## 2021-01-07 ENCOUNTER — Ambulatory Visit (INDEPENDENT_AMBULATORY_CARE_PROVIDER_SITE_OTHER): Payer: Commercial Managed Care - PPO | Admitting: "Endocrinology

## 2021-01-07 ENCOUNTER — Other Ambulatory Visit: Payer: Self-pay

## 2021-01-07 ENCOUNTER — Encounter: Payer: Self-pay | Admitting: "Endocrinology

## 2021-01-07 VITALS — BP 96/74 | HR 76 | Ht 63.0 in | Wt 157.6 lb

## 2021-01-07 DIAGNOSIS — E038 Other specified hypothyroidism: Secondary | ICD-10-CM | POA: Diagnosis not present

## 2021-01-07 DIAGNOSIS — E063 Autoimmune thyroiditis: Secondary | ICD-10-CM

## 2021-01-07 MED ORDER — LEVOTHYROXINE SODIUM 50 MCG PO TABS: 50.0000 ug | ORAL_TABLET | Freq: Every day | ORAL | 1 refills | Status: DC

## 2021-01-07 NOTE — Progress Notes (Signed)
01/07/2021, 11:55 AM      Endocrinology follow-up note   Kristen Hayes is a 50 y.o.-year-old female patient who is returning for follow-up for hypothyroidism due to Hashimoto's thyroiditis.   PMD  Caryl Bis, MD.   Past Medical History:  Diagnosis Date   Constipation    Cough 01/15/2015   Hemorrhoids 02/12/2013   Hypothyroid 02/12/2013   Nasal congestion 05/26/2015   Otitis media of right ear 01/15/2015   Sore throat 01/15/2015   Thyroid disease    Vaginal discharge 09/10/2013    Past Surgical History:  Procedure Laterality Date   BAND HEMORRHOIDECTOMY      Social History   Socioeconomic History   Marital status: Married    Spouse name: Not on file   Number of children: Not on file   Years of education: Not on file   Highest education level: Not on file  Occupational History   Not on file  Tobacco Use   Smoking status: Never   Smokeless tobacco: Never  Vaping Use   Vaping Use: Never used  Substance and Sexual Activity   Alcohol use: No   Drug use: No   Sexual activity: Yes    Birth control/protection: Other-see comments    Comment: vasectomy  Other Topics Concern   Not on file  Social History Narrative   Not on file   Social Determinants of Health   Financial Resource Strain: Low Risk    Difficulty of Paying Living Expenses: Not hard at all  Food Insecurity: No Food Insecurity   Worried About Charity fundraiser in the Last Year: Never true   Ran Out of Food in the Last Year: Never true  Transportation Needs: No Transportation Needs   Lack of Transportation (Medical): No   Lack of Transportation (Non-Medical): No  Physical Activity: Inactive   Days of Exercise per Week: 0 days   Minutes of Exercise per Session: 0 min  Stress: No Stress Concern Present   Feeling of Stress : Not at all  Social Connections: Socially Integrated   Frequency  of Communication with Friends and Family: More than three times a week   Frequency of Social Gatherings with Friends and Family: More than three times a week   Attends Religious Services: More than 4 times per year   Active Member of [employer] or Organizations: Yes   Attends Music therapist: More than 4 times per year   Marital Status: Married    Family History  Problem Relation Age of Onset   Heart disease Mother        heart attack   Thyroid disease Mother    Hypertension Mother    Hyperlipidemia Mother    Heart attack Mother    Diabetes Father    Heart disease Father    Hypertension Father  Hyperlipidemia Father    Heart attack Father    Bowel Disease Brother        Crohn's    Outpatient Encounter Medications as of 01/07/2021  Medication Sig   levothyroxine (SYNTHROID) 50 MCG tablet Take 1 tablet (50 mcg total) by mouth daily before breakfast. TAKE ONE TABLET BY MOUTH ONCE DAILY BEFORE BREAKFAST.   [DISCONTINUED] levothyroxine (SYNTHROID) 75 MCG tablet TAKE ONE TABLET BY MOUTH ONCE DAILY BEFORE BREAKFAST. (Patient taking differently: 50 mcg. TAKE ONE TABLET BY MOUTH ONCE DAILY BEFORE BREAKFAST.)   No facility-administered encounter medications on file as of 01/07/2021.    ALLERGIES: No Known Allergies VACCINATION STATUS: Immunization History  Administered Date(s) Administered   Influenza-Unspecified 12/30/2013   Moderna Sars-Covid-2 Vaccination 11/17/2019, 12/15/2019     HPI    Kristen Hayes  is a patient with the above medical history. Kristen Hayes was diagnosed  with hypothyroidism at approximate age of 25 years with which required subsequnt initiation of thyroid hormone replacement. Kristen Hayes was given various doses of levothyroxine over the years, currently on levothyroxine 50 mcg p.o. daily before breakfast.   Her dose was lowered from 75 mcg since her last visit due to iatrogenic thyrotoxicosis.   Kristen Hayes reports compliance to this medication: Her recent thyroid  function tests are consistent with appropriate replacement.  Her recent labs show evidence of Hashimoto's thyroiditis.  -Her major concern has been progressive weight loss.  Kristen Hayes is known to have fluctuating body weight.  Kristen Hayes denies palpitations, tremors, or heat intolerance.   Kristen Hayes admits to dietary indiscretions.  -Kristen Hayes denies dysphagia, shortness of breath, voice change.  Kristen Hayes has family history of what appears to be hypothyroidism in her mother. -Kristen Hayes is a mother of 2 grown children and has grand children.  Pt denies feeling nodules in neck, hoarseness.  Kristen Hayes recently underwent thyroid ultrasound which did not show any significant nodular lesions, but bilaterally shrunk lobes.  Kristen Hayes denies any family history of thyroid malignancy.   No history of thyroidectomy, nor radiation therapy to head or neck.   ROS: Limited as above.  Physical Exam: BP 96/74   Pulse 76   Ht 5\' 3"  (1.6 m)   Wt 157 lb 9.6 oz (71.5 kg)   LMP 12/09/2020 (Exact Date)   BMI 27.92 kg/m  Wt Readings from Last 3 Encounters:  01/07/21 157 lb 9.6 oz (71.5 kg)  12/21/20 159 lb 3.2 oz (72.2 kg)  04/08/20 156 lb (70.8 kg)      CMP ( most recent) CMP     Component Value Date/Time   NA 137 12/03/2020 0904   K 4.6 12/03/2020 0904   CL 104 12/03/2020 0904   CO2 23 12/03/2020 0904   GLUCOSE 88 12/03/2020 0904   GLUCOSE 86 02/27/2014 0906   BUN 13 12/03/2020 0904   CREATININE 0.89 12/03/2020 0904   CREATININE 0.71 02/27/2014 0906   CALCIUM 9.1 12/03/2020 0904   PROT 6.6 12/03/2020 0904   ALBUMIN 4.2 12/03/2020 0904   AST 18 12/03/2020 0904   ALT 11 12/03/2020 0904   ALKPHOS 63 12/03/2020 0904   BILITOT 0.5 12/03/2020 0904   GFRNONAA 93 10/31/2019 0842   GFRAA 107 10/31/2019 0842     Lipid Panel ( most recent) Lipid Panel     Component Value Date/Time   CHOL 167 12/03/2020 0904   TRIG 65 12/03/2020 0904   HDL 47 12/03/2020 0904   CHOLHDL 3.6 12/03/2020 0904   CHOLHDL 3.3 02/27/2014 0906   VLDL 13  02/27/2014  0906   LDLCALC 107 (H) 12/03/2020 0904   LABVLDL 13 12/03/2020 0904     Lab Results  Component Value Date   TSH 2.730 12/03/2020   TSH 0.545 03/19/2020   TSH 0.056 (L) 10/31/2019   TSH 0.022 (L) 06/19/2019   TSH 0.053 (L) 11/06/2018   TSH 0.953 09/04/2017   TSH 0.425 (L) 08/16/2016   TSH 0.678 05/04/2015   TSH 0.565 02/27/2014   TSH 0.951 10/31/2012   FREET4 1.47 12/03/2020   FREET4 1.37 03/19/2020   FREET4 1.78 (H) 10/31/2019   FREET4 1.73 06/19/2019   FREET4 1.58 11/06/2018     FINDINGS:  Parenchymal Echotexture: Moderately heterogenous   Isthmus: 3 mm   Right lobe: 3.6 x 1.2 x 1.7 cm   Left lobe: 3.1 x 1.0 x 1.3 cm   ______________________________________________________  IMPRESSION:  Mildly atrophic heterogeneous thyroid gland compatible with sequelae from prior thyroiditis.  No significant nodule or other finding by ultrasound.    ASSESSMENT: 1. Hypothyroidism 2.  Hashimoto's thyroiditis   PLAN:  Her previsit labs are consistent with appropriate replacement.  Kristen Hayes is advised to continue levothyroxine 50 mcg p.o. daily before breakfast with plan to repeat thyroid function test in 3 months.    - We discussed about the correct intake of her thyroid hormone, on empty stomach at fasting, with water, separated by at least 30 minutes from breakfast and other medications,  and separated by more than 4 hours from calcium, iron, multivitamins, acid reflux medications (PPIs). -Patient is made aware of the fact that thyroid hormone replacement is needed for life, dose to be adjusted by periodic monitoring of thyroid function tests.   Her labs are negative for celiac disease, reassured that Kristen Hayes can consume gluten. -Kristen Hayes is advised to eat with liquids, and use chewing gums to stimulate salivary production.  -Her recent thyroid ultrasound was unremarkable.    Regarding her weight concern: - Kristen Hayes acknowledges that there is a room for improvement in her food and  drink choices. - Suggestion is made for her to avoid simple carbohydrates  from her diet including Cakes, Sweet Desserts, Ice Cream, Soda (diet and regular), Sweet Tea, Candies, Chips, Cookies, Store Bought Juices, Alcohol in Excess of  1-2 drinks a day, Artificial Sweeteners,  Coffee Creamer, and "Sugar-free" Products, Lemonade. This will help patient to have more stable blood glucose profile and potentially avoid unintended weight gain.   Kristen Hayes is advised to maintain close follow-up with her PMD Dr. Gar Ponto.     I spent 25 minutes in the care of the patient today including review of labs from Thyroid Function, CMP, and other relevant labs ; imaging/biopsy records (current and previous including abstractions from other facilities); face-to-face time discussing  her lab results and symptoms, medications doses, her options of short and long term treatment based on the latest standards of care / guidelines;   and documenting the encounter.  Fumi Gustavo Lah  participated in the discussions, expressed understanding, and voiced agreement with the above plans.  All questions were answered to her satisfaction. Kristen Hayes is encouraged to contact clinic should Kristen Hayes have any questions or concerns prior to her return visit.   Return in about 3 months (around 04/08/2021) for F/U with Pre-visit Labs.  Glade Lloyd, MD [facility] [address] [place], [place] [address] Phone: [phone]  Fax: [phone]   01/07/2021, 11:55 AM  This note was partially dictated with voice recognition software. Similar sounding words can be  transcribed inadequately or may not  be corrected upon review.

## 2021-01-13 ENCOUNTER — Telehealth: Payer: Self-pay

## 2021-01-13 NOTE — Telephone Encounter (Signed)
Patient called back and said she called [facility] and they said she never had a prescription prior to this last appointment for 50 MCG. So does she need to take 75 MCG or 50 MCG? Joy, can you call her?

## 2021-01-13 NOTE — Telephone Encounter (Signed)
Pt states her pharmacy has only filled the 58mcg dose of levothyroxine and at her next to last office visit on 04/08/20 she was taking levothyroxine 34mcg. Pt wants to make sure which dose you would like for her to take.

## 2021-01-13 NOTE — Telephone Encounter (Signed)
Pt said that requested a refill in between appointments (between December and September) and that she was given 75 mcg, she said that she was taking it. She said that her husband went to get his medication and they gave her his and she said that its too early for her to get it but they gave her 50 mcg. She wanted to be sure that is what she was suppose to be taking because her blood work was good. I advised her that Dr Liliane Channel note says to take 50 mcg of the levothyroxine. Is this correct?  [phone].

## 2021-01-14 ENCOUNTER — Other Ambulatory Visit: Payer: Self-pay | Admitting: "Endocrinology

## 2021-01-14 MED ORDER — LEVOTHYROXINE SODIUM 75 MCG PO TABS: 75.0000 ug | ORAL_TABLET | Freq: Every day | ORAL | 1 refills | Status: DC

## 2021-01-14 NOTE — Telephone Encounter (Signed)
Pt states she has never taken the 9mcg dose and was taking 23mcg when she had her last lab work performed. Discussed with Dr.Nida, advised pt to continue levothyroxine 11mcg daily per Dr.Nida. Pt voiced understanding. Rx for levothyroxine 67mcg sent to [facility].

## 2021-01-14 NOTE — Addendum Note (Signed)
Addended by: Ellin Saba on: 01/14/2021 09:33 AM   Modules accepted: Orders

## 2021-01-21 ENCOUNTER — Other Ambulatory Visit: Payer: Commercial Managed Care - PPO | Admitting: Adult Health

## 2021-04-16 LAB — T4, FREE: Free T4: 1.35 ng/dL (ref 0.82–1.77)

## 2021-04-16 LAB — TSH: TSH: 2.68 u[IU]/mL (ref 0.450–4.500)

## 2021-04-29 ENCOUNTER — Encounter: Payer: Self-pay | Admitting: "Endocrinology

## 2021-04-29 ENCOUNTER — Ambulatory Visit: Payer: Commercial Managed Care - PPO | Admitting: "Endocrinology

## 2021-04-29 VITALS — BP 106/68 | HR 84 | Ht 63.0 in | Wt 157.8 lb

## 2021-04-29 DIAGNOSIS — E038 Other specified hypothyroidism: Secondary | ICD-10-CM

## 2021-04-29 DIAGNOSIS — E063 Autoimmune thyroiditis: Secondary | ICD-10-CM | POA: Diagnosis not present

## 2021-04-29 MED ORDER — LEVOTHYROXINE SODIUM 75 MCG PO TABS: 75.0000 ug | ORAL_TABLET | Freq: Every day | ORAL | 1 refills | Status: DC

## 2021-04-29 NOTE — Progress Notes (Signed)
04/29/2021, 9:18 AM      Endocrinology follow-up note   Kristen Hayes is a 51 y.o.-year-old female patient who is returning for follow-up for hypothyroidism due to Hashimoto's thyroiditis.   PMD  Caryl Bis, MD.   Past Medical History:  Diagnosis Date   Constipation    Cough 01/15/2015   Hemorrhoids 02/12/2013   Hypothyroid 02/12/2013   Nasal congestion 05/26/2015   Otitis media of right ear 01/15/2015   Sore throat 01/15/2015   Thyroid disease    Vaginal discharge 09/10/2013    Past Surgical History:  Procedure Laterality Date   BAND HEMORRHOIDECTOMY      Social History   Socioeconomic History   Marital status: Married    Spouse name: Not on file   Number of children: Not on file   Years of education: Not on file   Highest education level: Not on file  Occupational History   Not on file  Tobacco Use   Smoking status: Never   Smokeless tobacco: Never  Vaping Use   Vaping Use: Never used  Substance and Sexual Activity   Alcohol use: No   Drug use: No   Sexual activity: Yes    Birth control/protection: Other-see comments    Comment: vasectomy  Other Topics Concern   Not on file  Social History Narrative   Not on file   Social Determinants of Health   Financial Resource Strain: Low Risk    Difficulty of Paying Living Expenses: Not hard at all  Food Insecurity: No Food Insecurity   Worried About Charity fundraiser in the Last Year: Never true   Ran Out of Food in the Last Year: Never true  Transportation Needs: No Transportation Needs   Lack of Transportation (Medical): No   Lack of Transportation (Non-Medical): No  Physical Activity: Inactive   Days of Exercise per Week: 0 days   Minutes of Exercise per Session: 0 min  Stress: No Stress Concern Present   Feeling of Stress : Not at all  Social Connections: Socially Integrated   Frequency  of Communication with Friends and Family: More than three times a week   Frequency of Social Gatherings with Friends and Family: More than three times a week   Attends Religious Services: More than 4 times per year   Active Member of [employer] or Organizations: Yes   Attends Music therapist: More than 4 times per year   Marital Status: Married    Family History  Problem Relation Age of Onset   Heart disease Mother        heart attack   Thyroid disease Mother    Hypertension Mother    Hyperlipidemia Mother    Heart attack Mother    Diabetes Father    Heart disease Father    Hypertension Father  Hyperlipidemia Father    Heart attack Father    Bowel Disease Brother        Crohn's    Outpatient Encounter Medications as of 04/29/2021  Medication Sig   Omeprazole Magnesium (PRILOSEC PO) Take 1 tablet by mouth daily as needed.   levothyroxine (SYNTHROID) 75 MCG tablet Take 1 tablet (75 mcg total) by mouth daily before breakfast.   [DISCONTINUED] levothyroxine (SYNTHROID) 75 MCG tablet Take 1 tablet (75 mcg total) by mouth daily before breakfast.   No facility-administered encounter medications on file as of 04/29/2021.    ALLERGIES: No Known Allergies VACCINATION STATUS: Immunization History  Administered Date(s) Administered   Influenza-Unspecified 12/30/2013   Moderna Sars-Covid-2 Vaccination 11/17/2019, 12/15/2019     HPI    Kristen Hayes  is a patient with the above medical history. she was diagnosed  with hypothyroidism at approximate age of 17 years with which required subsequnt initiation of thyroid hormone replacement.  She is currently on levothyroxine 75 mcg p.o. daily before breakfast.  She reports compliance to his medication.  Her recent thyroid function tests are consistent with appropriate replacement.  She has no new complaints today.    -She presents with steady weight.   She is known to have fluctuating body weight.  She denies palpitations,  tremors, or heat intolerance.   She admits to dietary indiscretions.  -She denies dysphagia, shortness of breath, voice change.  She has family history of what appears to be hypothyroidism in her mother. -She is a mother of 2 grown children and has grand children.  Pt denies feeling nodules in neck, hoarseness.  She recently underwent thyroid ultrasound which did not show any significant nodular lesions, but bilaterally shrunk lobes.  she denies any family history of thyroid malignancy.   No history of thyroidectomy, nor radiation therapy to head or neck.   ROS: Limited as above.  Physical Exam: BP 106/68    Pulse 84    Ht 5\' 3"  (1.6 m)    Wt 157 lb 12.8 oz (71.6 kg)    BMI 27.95 kg/m  Wt Readings from Last 3 Encounters:  04/29/21 157 lb 12.8 oz (71.6 kg)  01/07/21 157 lb 9.6 oz (71.5 kg)  12/21/20 159 lb 3.2 oz (72.2 kg)      CMP ( most recent) CMP     Component Value Date/Time   NA 137 12/03/2020 0904   K 4.6 12/03/2020 0904   CL 104 12/03/2020 0904   CO2 23 12/03/2020 0904   GLUCOSE 88 12/03/2020 0904   GLUCOSE 86 02/27/2014 0906   BUN 13 12/03/2020 0904   CREATININE 0.89 12/03/2020 0904   CREATININE 0.71 02/27/2014 0906   CALCIUM 9.1 12/03/2020 0904   PROT 6.6 12/03/2020 0904   ALBUMIN 4.2 12/03/2020 0904   AST 18 12/03/2020 0904   ALT 11 12/03/2020 0904   ALKPHOS 63 12/03/2020 0904   BILITOT 0.5 12/03/2020 0904   GFRNONAA 93 10/31/2019 0842   GFRAA 107 10/31/2019 0842     Lipid Panel ( most recent) Lipid Panel     Component Value Date/Time   CHOL 167 12/03/2020 0904   TRIG 65 12/03/2020 0904   HDL 47 12/03/2020 0904   CHOLHDL 3.6 12/03/2020 0904   CHOLHDL 3.3 02/27/2014 0906   VLDL 13 02/27/2014 0906   LDLCALC 107 (H) 12/03/2020 0904   LABVLDL 13 12/03/2020 0904     Lab Results  Component Value Date   TSH 2.680 04/15/2021   TSH 2.730 12/03/2020  TSH 0.545 03/19/2020   TSH 0.056 (L) 10/31/2019   TSH 0.022 (L) 06/19/2019   TSH 0.053 (L)  11/06/2018   TSH 0.953 09/04/2017   TSH 0.425 (L) 08/16/2016   TSH 0.678 05/04/2015   TSH 0.565 02/27/2014   FREET4 1.35 04/15/2021   FREET4 1.47 12/03/2020   FREET4 1.37 03/19/2020   FREET4 1.78 (H) 10/31/2019   FREET4 1.73 06/19/2019   FREET4 1.58 11/06/2018     FINDINGS:  Parenchymal Echotexture: Moderately heterogenous   Isthmus: 3 mm   Right lobe: 3.6 x 1.2 x 1.7 cm   Left lobe: 3.1 x 1.0 x 1.3 cm   ______________________________________________________  IMPRESSION:  Mildly atrophic heterogeneous thyroid gland compatible with sequelae from prior thyroiditis.  No significant nodule or other finding by ultrasound.    ASSESSMENT: 1. Hypothyroidism 2.  Hashimoto's thyroiditis   PLAN:  Her previsit labs are consistent with appropriate replacement.  She is advised to continue levothyroxine 75 mcg p.o. daily before breakfast.     - We discussed about the correct intake of her thyroid hormone, on empty stomach at fasting, with water, separated by at least 30 minutes from breakfast and other medications,  and separated by more than 4 hours from calcium, iron, multivitamins, acid reflux medications (PPIs). -Patient is made aware of the fact that thyroid hormone replacement is needed for life, dose to be adjusted by periodic monitoring of thyroid function tests.    Her labs are negative for celiac disease, reassured that she can consume gluten. -She is advised to eat with liquids, and use chewing gums to stimulate salivary production.  -Her recent thyroid ultrasound was unremarkable.     She is advised to maintain close follow-up with her PMD Dr. Gar Ponto.     I spent 20 minutes in the care of the patient today including review of labs from Thyroid Function, CMP, and other relevant labs ; imaging/biopsy records (current and previous including abstractions from other facilities); face-to-face time discussing  her lab results and symptoms, medications doses, her options  of short and long term treatment based on the latest standards of care / guidelines;   and documenting the encounter.  Jaydence Gustavo Lah  participated in the discussions, expressed understanding, and voiced agreement with the above plans.  All questions were answered to her satisfaction. she is encouraged to contact clinic should she have any questions or concerns prior to her return visit.    Return in about 6 months (around 10/27/2021) for F/U with Pre-visit Labs.  Glade Lloyd, MD [facility] [address] [place], [place] [address] Phone: [phone]  Fax: [phone]   04/29/2021, 9:18 AM  This note was partially dictated with voice recognition software. Similar sounding words can be transcribed inadequately or may not  be corrected upon review.

## 2021-05-05 ENCOUNTER — Other Ambulatory Visit: Payer: Self-pay | Admitting: Adult Health

## 2021-05-05 MED ORDER — VALACYCLOVIR HCL 1 G PO TABS: ORAL_TABLET | ORAL | 0 refills | Status: DC

## 2021-05-05 MED ORDER — OMEPRAZOLE 40 MG PO CPDR: 40.0000 mg | DELAYED_RELEASE_CAPSULE | Freq: Every day | ORAL | 2 refills | Status: DC

## 2021-05-05 NOTE — Progress Notes (Signed)
Will refill Prilosec and valtrex

## 2021-05-24 ENCOUNTER — Encounter: Payer: Self-pay | Admitting: Adult Health

## 2021-06-08 ENCOUNTER — Other Ambulatory Visit: Payer: Self-pay | Admitting: Adult Health

## 2021-06-08 DIAGNOSIS — Z1321 Encounter for screening for nutritional disorder: Secondary | ICD-10-CM

## 2021-06-08 DIAGNOSIS — Z13220 Encounter for screening for lipoid disorders: Secondary | ICD-10-CM

## 2021-06-08 DIAGNOSIS — Z01419 Encounter for gynecological examination (general) (routine) without abnormal findings: Secondary | ICD-10-CM

## 2021-06-08 DIAGNOSIS — Z131 Encounter for screening for diabetes mellitus: Secondary | ICD-10-CM

## 2021-06-08 NOTE — Progress Notes (Signed)
Check CBC,CMP,lipids, A1c and vitamin D

## 2021-07-08 ENCOUNTER — Other Ambulatory Visit: Payer: Self-pay

## 2021-07-08 ENCOUNTER — Ambulatory Visit (INDEPENDENT_AMBULATORY_CARE_PROVIDER_SITE_OTHER): Payer: Commercial Managed Care - PPO | Admitting: Adult Health

## 2021-07-08 ENCOUNTER — Encounter: Payer: Self-pay | Admitting: Adult Health

## 2021-07-08 ENCOUNTER — Other Ambulatory Visit (HOSPITAL_COMMUNITY)
Admission: RE | Admit: 2021-07-08 | Discharge: 2021-07-08 | Disposition: A | Discharge status: Home / Self Care | Payer: Commercial Managed Care - PPO | Source: Ambulatory Visit | Attending: Adult Health | Admitting: Adult Health

## 2021-07-08 VITALS — BP 112/72 | HR 72 | Ht 63.0 in | Wt 159.0 lb

## 2021-07-08 DIAGNOSIS — Z01419 Encounter for gynecological examination (general) (routine) without abnormal findings: Secondary | ICD-10-CM | POA: Diagnosis present

## 2021-07-08 DIAGNOSIS — Z1211 Encounter for screening for malignant neoplasm of colon: Secondary | ICD-10-CM

## 2021-07-08 LAB — HEMOCCULT GUIAC POC 1CARD (OFFICE): Fecal Occult Blood, POC: NEGATIVE

## 2021-07-08 NOTE — Progress Notes (Signed)
Patient ID: Kristen Hayes, female   DOB: June 25, 1970, 51 y.o.   MRN: [id number] ?History of Present Illness: ?[name] is a 51 year old white female, married, G2P2, in for a well woman gyn exam and pap and she had fasting labs this morning. She sees Dr Dorris Fetch for thyroid.  ?She is working from home.  ?PCP is Dr Quillian Quince. ? ? ? ?Current Medications, Allergies, Past Medical History, Past Surgical History, Family History and Social History were reviewed in [employer] record.   ? ? ?Review of Systems: ?Patient denies any headaches, hearing loss, fatigue, blurred vision, shortness of breath, chest pain, abdominal pain, problems with bowel movements, urination, or intercourse. No joint pain or mood swings.  ?Still having regular periods.  ? ? ?Physical Exam:BP 112/72 (BP Location: Right Arm, Patient Position: Sitting, Cuff Size: Normal)   Pulse 72   Ht '5\' 3"'$  (1.6 m)   Wt 159 lb (72.1 kg)   LMP 06/19/2021 (Exact Date)   BMI 28.17 kg/m?   ?General:  Well developed, well nourished, no acute distress ?Skin:  Warm and dry ?Neck:  Midline trachea, normal thyroid, good ROM, no lymphadenopathy ?Lungs; Clear to auscultation bilaterally ?Breast:  No dominant palpable mass, retraction, or nipple discharge ?Cardiovascular: Regular rate and rhythm ?Abdomen:  Soft, non tender, no hepatosplenomegaly ?Pelvic:  External genitalia is normal in appearance, no lesions.  The vagina is normal in appearance. Urethra has no lesions or masses. The cervix is smooth,pap with HR HPV genotyping performed. Uterus is felt to be normal size, shape, and contour.  No adnexal masses or tenderness noted.Bladder is non tender, no masses felt. ?Rectal: Good sphincter tone, no polyps,  + hemorrhoids felt.  Hemoccult negative. ?Extremities/musculoskeletal:  No swelling or varicosities noted, no clubbing or cyanosis ?Psych:  No mood changes, alert and cooperative,seems happy ?AA is 0 ?Fall risk is low ? ?  07/08/2021  ?  8:30 AM 12/21/2020  ?  3:36  PM 12/04/2019  ?  3:42 PM 11/04/2018  ?  8:53 AM 09/04/2017  ?  9:12 AM  ?Depression screen PHQ 2/9  ?Decreased Interest 0 0 0 0 0  ?Down, Depressed, Hopeless 0 0 0 0 0  ?PHQ - 2 Score 0 0 0 0 0  ?Altered sleeping 0 0   0  ?Tired, decreased energy 0 0   0  ?Change in appetite 0 0   0  ?Feeling bad or failure about yourself  0 0   0  ?Trouble concentrating 0 0   0  ?Moving slowly or fidgety/restless 0 0   0  ?Suicidal thoughts 0 0   0  ?PHQ-9 Score 0 0   0  ? ? ?  07/08/2021  ?  8:31 AM 12/21/2020  ?  3:37 PM  ?GAD 7 : Generalized Anxiety Score  ?Nervous, Anxious, on Edge 0 0  ?Control/stop worrying 0 0  ?Worry too much - different things 0 0  ?Trouble relaxing 0 0  ?Restless 0 0  ?Easily annoyed or irritable 0 0  ?Afraid - awful might happen 0 0  ?Total GAD 7 Score 0 0  ? ?  ? Upstream - 07/08/21 0831   ? ?  ? Pregnancy Intention Screening  ? Does the patient want to become pregnant in the next year? No   ? Does the patient's partner want to become pregnant in the next year? No   ? Would the patient like to discuss contraceptive options today? No   ?  ?  Contraception Wrap Up  ? Current Method Vasectomy   ? End Method Vasectomy   ? Contraception Counseling Provided No   ? ?  ?  ? ?  ?  ?Examination chaperoned by Celene Squibb LPN ? ? ?Impression and Plan: ?1. Encounter for gynecological examination with Papanicolaou smear of cervix ?Pap sent ?Physical in 1 year ?Pap in 3 if normal ?Mammogram yearly ?Colonoscopy in near future ?Had labs today ? ?2. Encounter for screening fecal occult blood testing ? ? ? ?  ?  ?

## 2021-07-09 LAB — COMPREHENSIVE METABOLIC PANEL
ALT: 18 IU/L (ref 0–32)
AST: 23 IU/L (ref 0–40)
Albumin/Globulin Ratio: 2 (ref 1.2–2.2)
Albumin: 4.3 g/dL (ref 3.8–4.8)
Alkaline Phosphatase: 69 IU/L (ref 44–121)
BUN/Creatinine Ratio: 20 (ref 9–23)
BUN: 17 mg/dL (ref 6–24)
Bilirubin Total: 0.2 mg/dL (ref 0.0–1.2)
CO2: 24 mmol/L (ref 20–29)
Calcium: 9.3 mg/dL (ref 8.7–10.2)
Chloride: 102 mmol/L (ref 96–106)
Creatinine, Ser: 0.83 mg/dL (ref 0.57–1.00)
Globulin, Total: 2.2 g/dL (ref 1.5–4.5)
Glucose: 100 mg/dL — ABNORMAL HIGH (ref 70–99)
Potassium: 4.3 mmol/L (ref 3.5–5.2)
Sodium: 140 mmol/L (ref 134–144)
Total Protein: 6.5 g/dL (ref 6.0–8.5)
eGFR: 86 mL/min/{1.73_m2} (ref >59)

## 2021-07-09 LAB — LIPID PANEL
Chol/HDL Ratio: 3.5 ratio (ref 0.0–4.4)
Cholesterol, Total: 174 mg/dL (ref 100–199)
HDL: 50 mg/dL (ref >39)
LDL Chol Calc (NIH): 111 mg/dL — ABNORMAL HIGH (ref 0–99)
Triglycerides: 67 mg/dL (ref 0–149)
VLDL Cholesterol Cal: 13 mg/dL (ref 5–40)

## 2021-07-09 LAB — CBC
Hematocrit: 41.2 % (ref 34.0–46.6)
Hemoglobin: 13.6 g/dL (ref 11.1–15.9)
MCH: 29.6 pg (ref 26.6–33.0)
MCHC: 33 g/dL (ref 31.5–35.7)
MCV: 90 fL (ref 79–97)
Platelets: 283 10*3/uL (ref 150–450)
RBC: 4.6 x10E6/uL (ref 3.77–5.28)
RDW: 12.3 % (ref 11.7–15.4)
WBC: 5.8 10*3/uL (ref 3.4–10.8)

## 2021-07-09 LAB — VITAMIN D 25 HYDROXY (VIT D DEFICIENCY, FRACTURES): Vit D, 25-Hydroxy: 25.8 ng/mL — ABNORMAL LOW (ref 30.0–100.0)

## 2021-07-09 LAB — HEMOGLOBIN A1C
Est. average glucose Bld gHb Est-mCnc: 114 mg/dL
Hgb A1c MFr Bld: 5.6 % (ref 4.8–5.6)

## 2021-07-12 ENCOUNTER — Other Ambulatory Visit: Payer: Self-pay | Admitting: Adult Health

## 2021-07-12 LAB — CYTOLOGY - PAP
Adequacy: ABSENT
Comment: NEGATIVE
Diagnosis: NEGATIVE
High risk HPV: NEGATIVE

## 2021-07-12 MED ORDER — FLUCONAZOLE 150 MG PO TABS: ORAL_TABLET | ORAL | 1 refills | Status: DC

## 2021-07-12 NOTE — Progress Notes (Signed)
+  yeast on pap, requests diflucan, will rx  ?

## 2021-08-16 ENCOUNTER — Telehealth: Payer: Self-pay | Admitting: Adult Health

## 2021-08-16 NOTE — Telephone Encounter (Signed)
Patient called stating that she just received a bill from [facility] for 248 regarding her Vitamin D test. Patient would like for [facility] to change the code for her labs E55.9 to state her Vitamin D Deficiency because that is what was checking this second time. Please contact pt  ?

## 2021-08-22 NOTE — Telephone Encounter (Signed)
Pt aware that I spoke with Joe at [facility] and he will get labs redone using E55.9 ?

## 2021-08-29 ENCOUNTER — Ambulatory Visit: Payer: Commercial Managed Care - PPO | Admitting: Orthopedic Surgery

## 2021-08-29 DIAGNOSIS — M7521 Bicipital tendinitis, right shoulder: Secondary | ICD-10-CM

## 2021-08-29 NOTE — Progress Notes (Signed)
Last office visit December 2020 ? ? ?Chief Complaint  ?Patient presents with  ? Pain right shoulder  ?  History of biceps tendon injection in 2019 recurrent pain in same area  ? ? ?HPI: This is a 51 year old female who had an biceps tendon injection in 2019 comes in with recurrent pain right shoulder just off the anterolateral acromion associated with difficulty sleeping nonradiating pain no weakness ? ?Saw Dr. Luna Glasgow December 2020 ? ?Past Medical History:  ?Diagnosis Date  ? Constipation   ? Cough 01/15/2015  ? Hemorrhoids 02/12/2013  ? Hypothyroid 02/12/2013  ? Nasal congestion 05/26/2015  ? Otitis media of right ear 01/15/2015  ? Sore throat 01/15/2015  ? Thyroid disease   ? Vaginal discharge 09/10/2013  ? ?General appearance: Well-developed well-nourished no gross deformities ? ?Cardiovascular normal pulse and perfusion normal color without edema ? ?Neurologically no sensation loss or deficits or pathologic reflexes ? ?Psychological: Awake alert and oriented x3 mood and affect normal ? ?Skin no lacerations or ulcerations no nodularity no palpable masses, no erythema or nodularity ? ?Musculoskeletal: Pain with internal rotation of the right compared to the left but motion is equal no external rotation deficit full forward elevation and abduction with good strength in both ? ?Imaging no imaging needed ? ?A/P ? ?Encounter Diagnosis  ?Name Primary?  ? Biceps tendinitis of right upper extremity Yes  ? ? ? ?Probable recurrent biceps proximal tendinitis ? ?Inject biceps tendon ? ?Procedure: ?Biceps tendon injection ?Right biceps tendon sheath was injected ?The patient gave verbal consent for cortisone injection ?Timeout confirmed the site of injection ?Medications used included 40 mg of Depo-Medrol and 3 mL 1% lidocaine ?After alcohol and ethyl chloride preparation the point of maximal tenderness was injected over the right biceps tendon there were no complications  ?

## 2021-10-07 LAB — TSH: TSH: 2.49 u[IU]/mL (ref 0.450–4.500)

## 2021-10-07 LAB — T4, FREE: Free T4: 1.22 ng/dL (ref 0.82–1.77)

## 2021-10-19 ENCOUNTER — Other Ambulatory Visit: Payer: Self-pay | Admitting: "Endocrinology

## 2021-10-28 ENCOUNTER — Ambulatory Visit: Payer: Commercial Managed Care - PPO | Admitting: "Endocrinology

## 2021-11-04 ENCOUNTER — Ambulatory Visit: Payer: Commercial Managed Care - PPO | Admitting: "Endocrinology

## 2021-11-04 ENCOUNTER — Encounter: Payer: Self-pay | Admitting: "Endocrinology

## 2021-11-04 VITALS — BP 108/76 | HR 80 | Ht 63.0 in | Wt 158.4 lb

## 2021-11-04 DIAGNOSIS — E063 Autoimmune thyroiditis: Secondary | ICD-10-CM | POA: Insufficient documentation

## 2021-11-04 DIAGNOSIS — E782 Mixed hyperlipidemia: Secondary | ICD-10-CM | POA: Diagnosis not present

## 2021-11-04 DIAGNOSIS — E038 Other specified hypothyroidism: Secondary | ICD-10-CM | POA: Diagnosis not present

## 2021-11-04 MED ORDER — LEVOTHYROXINE SODIUM 75 MCG PO TABS
ORAL_TABLET | ORAL | 1 refills | Status: DC
Start: 2021-11-04 — End: 2022-03-14

## 2021-11-04 NOTE — Progress Notes (Signed)
11/04/2021, 12:32 PM      Endocrinology follow-up note   Kristen Hayes is a 51 y.o.-year-old female patient who is returning for follow-up for hypothyroidism due to Hashimoto's thyroiditis.   PMD  Caryl Bis, MD.   Past Medical History:  Diagnosis Date   Constipation    Cough 01/15/2015   Hemorrhoids 02/12/2013   Hypothyroid 02/12/2013   Nasal congestion 05/26/2015   Otitis media of right ear 01/15/2015   Sore throat 01/15/2015   Thyroid disease    Vaginal discharge 09/10/2013    Past Surgical History:  Procedure Laterality Date   BAND HEMORRHOIDECTOMY      Social History   Socioeconomic History   Marital status: Married    Spouse name: Not on file   Number of children: Not on file   Years of education: Not on file   Highest education level: Not on file  Occupational History   Not on file  Tobacco Use   Smoking status: Never   Smokeless tobacco: Never  Vaping Use   Vaping Use: Never used  Substance and Sexual Activity   Alcohol use: No   Drug use: No   Sexual activity: Yes    Birth control/protection: Other-see comments    Comment: vasectomy  Other Topics Concern   Not on file  Social History Narrative   Not on file   Social Determinants of Health   Financial Resource Strain: Low Risk  (07/08/2021)   Overall Financial Resource Strain (CARDIA)    Difficulty of Paying Living Expenses: Not hard at all  Food Insecurity: No Food Insecurity (07/08/2021)   Hunger Vital Sign    Worried About Running Out of Food in the Last Year: Never true    Ran Out of Food in the Last Year: Never true  Transportation Needs: No Transportation Needs (07/08/2021)   PRAPARE - Hydrologist (Medical): No    Lack of Transportation (Non-Medical): No  Physical Activity: Inactive (07/08/2021)   Exercise Vital Sign    Days of Exercise per Week: 0  days    Minutes of Exercise per Session: 0 min  Stress: No Stress Concern Present (07/08/2021)   [place]    Feeling of Stress : Not at all  Social Connections: [place] (07/08/2021)   Social Connection and Isolation Panel [NHANES]    Frequency of Communication with Friends and Family: More than three times a week    Frequency of Social Gatherings with Friends and Family: More than three times a week    Attends Religious Services: More than 4 times per year    Active Member of [employer] or Organizations: Yes    Attends Music therapist: More than 4 times per year    Marital Status: Married  Family History  Problem Relation Age of Onset   Heart disease Mother        heart attack   Thyroid disease Mother    Hypertension Mother    Hyperlipidemia Mother    Heart attack Mother    Diabetes Father    Heart disease Father    Hypertension Father    Hyperlipidemia Father    Heart attack Father    Bowel Disease Brother        Crohn's    Outpatient Encounter Medications as of 11/04/2021  Medication Sig   fluconazole (DIFLUCAN) 150 MG tablet Take 1 now and 1 in 3 days if needed (Patient not taking: Reported on 08/29/2021)   levothyroxine (SYNTHROID) 75 MCG tablet TAKE ONE TABLET BY MOUTH ONCE DAILY BEFORE BREAKFAST   omeprazole (PRILOSEC) 40 MG capsule Take 1 capsule (40 mg total) by mouth daily.   valACYclovir (VALTREX) 1000 MG tablet Take 2 at first sign of cold sore and repeat in 24 hours   [DISCONTINUED] levothyroxine (SYNTHROID) 75 MCG tablet TAKE ONE TABLET BY MOUTH ONCE DAILY BEFORE BREAKFAST   No facility-administered encounter medications on file as of 11/04/2021.    ALLERGIES: No Known Allergies VACCINATION STATUS: Immunization History  Administered Date(s) Administered   Influenza-Unspecified 12/30/2013   Moderna Sars-Covid-2 Vaccination 11/17/2019, 12/15/2019     HPI    Kristen Hayes  is a patient with the above medical history. she was diagnosed  with hypothyroidism at approximate age of 17 years with which required subsequnt initiation of thyroid hormone replacement.  She is currently on levothyroxine 75 mcg p.o. daily before breakfast. She reports compliance to his medication.  Her recent thyroid function tests are consistent with appropriate replacement.  She has no new complaints today.    -She presents with steady weight.   She is known to have fluctuating body weight.  She denies palpitations, tremors, or heat intolerance.   She admits to dietary indiscretions.  -She denies dysphagia, shortness of breath, voice change.  She has family history of what appears to be hypothyroidism in her mother. -She is a mother of 2 grown children and has grand children.  Pt denies feeling nodules in neck, hoarseness.  She recently underwent thyroid ultrasound which did not show any significant nodular lesions, but bilaterally shrunk lobes.  she denies any family history of thyroid malignancy.   No history of thyroidectomy, nor radiation therapy to head or neck.   ROS: Limited as above.  Physical Exam: BP 108/76   Pulse 80   Ht '5\' 3"'$  (1.6 m)   Wt 158 lb 6.4 oz (71.8 kg)   BMI 28.06 kg/m  Wt Readings from Last 3 Encounters:  11/04/21 158 lb 6.4 oz (71.8 kg)  07/08/21 159 lb (72.1 kg)  04/29/21 157 lb 12.8 oz (71.6 kg)      CMP ( most recent) CMP     Component Value Date/Time   NA 140 07/08/2021 0806   K 4.3 07/08/2021 0806   CL 102 07/08/2021 0806   CO2 24 07/08/2021 0806   GLUCOSE 100 (H) 07/08/2021 0806   GLUCOSE 86 02/27/2014 0906   BUN 17 07/08/2021 0806   CREATININE 0.83 07/08/2021 0806   CREATININE 0.71 02/27/2014 0906   CALCIUM 9.3 07/08/2021 0806   PROT 6.5 07/08/2021 0806   ALBUMIN 4.3 07/08/2021 0806   AST 23 07/08/2021 0806   ALT 18 07/08/2021 0806   ALKPHOS 69 07/08/2021 0806   BILITOT 0.2 07/08/2021 0806  GFRNONAA 93 10/31/2019 0842    GFRAA 107 10/31/2019 0842     Lipid Panel ( most recent) Lipid Panel     Component Value Date/Time   CHOL 174 07/08/2021 0806   TRIG 67 07/08/2021 0806   HDL 50 07/08/2021 0806   CHOLHDL 3.5 07/08/2021 0806   CHOLHDL 3.3 02/27/2014 0906   VLDL 13 02/27/2014 0906   LDLCALC 111 (H) 07/08/2021 0806   LABVLDL 13 07/08/2021 0806     Lab Results  Component Value Date   TSH 2.490 10/06/2021   TSH 2.680 04/15/2021   TSH 2.730 12/03/2020   TSH 0.545 03/19/2020   TSH 0.056 (L) 10/31/2019   TSH 0.022 (L) 06/19/2019   TSH 0.053 (L) 11/06/2018   TSH 0.953 09/04/2017   TSH 0.425 (L) 08/16/2016   TSH 0.678 05/04/2015   FREET4 1.22 10/06/2021   FREET4 1.35 04/15/2021   FREET4 1.47 12/03/2020   FREET4 1.37 03/19/2020   FREET4 1.78 (H) 10/31/2019   FREET4 1.73 06/19/2019   FREET4 1.58 11/06/2018     FINDINGS:  Parenchymal Echotexture: Moderately heterogenous   Isthmus: 3 mm   Right lobe: 3.6 x 1.2 x 1.7 cm   Left lobe: 3.1 x 1.0 x 1.3 cm   ______________________________________________________  IMPRESSION:  Mildly atrophic heterogeneous thyroid gland compatible with sequelae from prior thyroiditis.  No significant nodule or other finding by ultrasound.    ASSESSMENT: 1. Hypothyroidism 2.  Hashimoto's thyroiditis 3.  Hyperlipidemia   PLAN:  Her previsit labs are consistent with appropriate replacement.  She is advised to continue levothyroxine 75 mcg p.o. daily before breakfast.      - We discussed about the correct intake of her thyroid hormone, on empty stomach at fasting, with water, separated by at least 30 minutes from breakfast and other medications,  and separated by more than 4 hours from calcium, iron, multivitamins, acid reflux medications (PPIs). -Patient is made aware of the fact that thyroid hormone replacement is needed for life, dose to be adjusted by periodic monitoring of thyroid function tests.  For her hyperlipidemia, she will not need statin  treatment for now.  However she is offered lifestyle medicine. - she acknowledges that there is a room for improvement in her food and drink choices. - Suggestion is made for her to avoid simple carbohydrates  from her diet including Cakes, Sweet Desserts, Ice Cream, Soda (diet and regular), Sweet Tea, Candies, Chips, Cookies, Store Bought Juices, Alcohol , Artificial Sweeteners,  Coffee Creamer, and "Sugar-free" Products, Lemonade. This will help patient to have more stable blood glucose profile and potentially avoid unintended weight gain.  The following Lifestyle Medicine recommendations according to [place]  [facility]) were discussed and and offered to patient and she  agrees to start the journey:  A. Whole Foods, Plant-Based Nutrition comprising of fruits and vegetables, plant-based proteins, whole-grain carbohydrates was discussed in detail with the patient.   A list for source of those nutrients were also provided to the patient.  Patient will use only water or unsweetened tea for hydration. B.  The need to stay away from risky substances including alcohol, smoking; obtaining 7 to 9 hours of restorative sleep, at least 150 minutes of moderate intensity exercise weekly, the importance of healthy social connections,  and stress management techniques were discussed. C.  A full color page of  Calorie density of various food groups per pound showing examples of each food groups was provided to the patient.  Her labs are negative for celiac disease, reassured that she can consume gluten. -She is advised to eat with liquids, and use chewing gums to stimulate salivary production.  -Her recent thyroid ultrasound was unremarkable.     She is advised to maintain close follow-up with her PMD Dr. Gar Ponto.      I spent 32 minutes in the care of the patient today including review of labs from Thyroid Function, CMP, and other relevant labs ; imaging/biopsy records  (current and previous including abstractions from other facilities); face-to-face time discussing  her lab results and symptoms, medications doses, her options of short and long term treatment based on the latest standards of care / guidelines;   and documenting the encounter.  Bayla Gustavo Lah  participated in the discussions, expressed understanding, and voiced agreement with the above plans.  All questions were answered to her satisfaction. she is encouraged to contact clinic should she have any questions or concerns prior to her return visit.     Return in about 6 months (around 05/07/2022) for F/U with Pre-visit Labs.  Glade Lloyd, MD [facility] [address] [place], [place] [address] Phone: [phone]  Fax: [phone]   11/04/2021, 12:32 PM  This note was partially dictated with voice recognition software. Similar sounding words can be transcribed inadequately or may not  be corrected upon review.

## 2022-03-10 ENCOUNTER — Other Ambulatory Visit: Payer: Self-pay | Admitting: "Endocrinology

## 2022-05-12 ENCOUNTER — Ambulatory Visit: Payer: Commercial Managed Care - PPO | Admitting: "Endocrinology

## 2022-06-15 ENCOUNTER — Encounter: Payer: Self-pay | Admitting: Radiology

## 2022-07-11 ENCOUNTER — Ambulatory Visit (INDEPENDENT_AMBULATORY_CARE_PROVIDER_SITE_OTHER): Payer: Federal, State, Local not specified - PPO | Admitting: Adult Health

## 2022-07-11 ENCOUNTER — Encounter: Payer: Self-pay | Admitting: Adult Health

## 2022-07-11 VITALS — BP 124/82 | HR 71 | Ht 63.0 in | Wt 162.0 lb

## 2022-07-11 DIAGNOSIS — Z01419 Encounter for gynecological examination (general) (routine) without abnormal findings: Secondary | ICD-10-CM

## 2022-07-11 DIAGNOSIS — E063 Autoimmune thyroiditis: Secondary | ICD-10-CM

## 2022-07-11 DIAGNOSIS — Z13220 Encounter for screening for lipoid disorders: Secondary | ICD-10-CM

## 2022-07-11 DIAGNOSIS — Z1211 Encounter for screening for malignant neoplasm of colon: Secondary | ICD-10-CM

## 2022-07-11 DIAGNOSIS — E038 Other specified hypothyroidism: Secondary | ICD-10-CM | POA: Diagnosis not present

## 2022-07-11 DIAGNOSIS — K219 Gastro-esophageal reflux disease without esophagitis: Secondary | ICD-10-CM

## 2022-07-11 DIAGNOSIS — Z131 Encounter for screening for diabetes mellitus: Secondary | ICD-10-CM

## 2022-07-11 LAB — HEMOCCULT GUIAC POC 1CARD (OFFICE): Fecal Occult Blood, POC: NEGATIVE

## 2022-07-11 MED ORDER — OMEPRAZOLE 40 MG PO CPDR: 40.0000 mg | DELAYED_RELEASE_CAPSULE | Freq: Every day | ORAL | 2 refills | Status: AC

## 2022-07-11 NOTE — Progress Notes (Signed)
Patient ID: Kristen Hayes, female   DOB: 1970-06-12, 52 y.o.   MRN: [id number] History of Present Illness: Kristen Hayes is a 52 year old white female, married, G2P2, in for a well woman gyn exam. She wants refill on Prilosec. 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   Last pap was negative HPV, NILM 07/08/21.  PCP is Dr Quillian Quince.   Current Medications, Allergies, Past Medical History, Past Surgical History, Family History and Social History were reviewed in [employer] record.     Review of Systems: Patient denies any headaches, hearing loss, fatigue, blurred vision, shortness of breath, chest pain, abdominal pain, problems with bowel movements, urination, or intercourse. No joint pain or mood swings.     Physical Exam:BP 124/82 (BP Location: Left Arm, Patient Position: Sitting, Cuff Size: Normal)   Pulse 71   Ht 5\' 3"  (1.6 m)   Wt 162 lb (73.5 kg)   LMP 06/29/2022   BMI 28.70 kg/m   General:  Well developed, well nourished, no acute distress Skin:  Warm and dry Neck:  Midline trachea, normal thyroid, good ROM, no lymphadenopathy Lungs; Clear to auscultation bilaterally Breast:  No dominant palpable mass, retraction, or nipple discharge Cardiovascular: Regular rate and rhythm Abdomen:  Soft, non tender, no hepatosplenomegaly Pelvic:  External genitalia is normal in appearance, no lesions.  The vagina is normal in appearance. Urethra has no lesions or masses. The cervix is bulbous.  Uterus is felt to be normal size, shape, and contour.  No adnexal masses or tenderness noted.Bladder is non tender, no masses felt. Rectal: Good sphincter tone, no polyps, or hemorrhoids felt.  Hemoccult negative. Extremities/musculoskeletal:  No swelling or varicosities noted, no clubbing or cyanosis Psych:  No mood changes, alert and cooperative,seems happy AA is 0 Fall risk is low    07/11/2022    8:38 AM 07/08/2021    8:30 AM 12/21/2020    3:36 PM  Depression screen PHQ 2/9  Decreased  Interest 0 0 0  Down, Depressed, Hopeless 0 0 0  PHQ - 2 Score 0 0 0  Altered sleeping 0 0 0  Tired, decreased energy 0 0 0  Change in appetite 0 0 0  Feeling bad or failure about yourself  0 0 0  Trouble concentrating 0 0 0  Moving slowly or fidgety/restless 0 0 0  Suicidal thoughts 0 0 0  PHQ-9 Score 0 0 0       07/11/2022    8:38 AM 07/08/2021    8:31 AM 12/21/2020    3:37 PM  GAD 7 : Generalized Anxiety Score  Nervous, Anxious, on Edge 0 0 0  Control/stop worrying 0 0 0  Worry too much - different things 0 0 0  Trouble relaxing 0 0 0  Restless 0 0 0  Easily annoyed or irritable 0 0 0  Afraid - awful might happen 0 0 0  Total GAD 7 Score 0 0 0      Upstream - 07/11/22 0844       Pregnancy Intention Screening   Does the patient want to become pregnant in the next year? No    Does the patient's partner want to become pregnant in the next year? No    Would the patient like to discuss contraceptive options today? No      Contraception Wrap Up   Current Method Vasectomy    End Method Vasectomy    Contraception Counseling Provided No  Examination chaperoned by Levy Pupa LPN  Impression and Plan: 1. Encounter for well woman exam with routine gynecological exam Physical in 1 year Pap 2026 Will check labs  Had negative mammogram 06/15/22 in [place] Colonoscopy per GI - CBC - Comprehensive metabolic panel Stay active  2. Encounter for screening fecal occult blood testing Hemoccult was negative  - POCT occult blood stool  3. Gastroesophageal reflux disease without esophagitis Will refill Prilosec Meds ordered this encounter  Medications   omeprazole (PRILOSEC) 40 MG capsule    Sig: Take 1 capsule (40 mg total) by mouth daily.    Dispense:  90 capsule    Refill:  2    Order Specific Question:   Supervising Provider    Answer:   Elonda Husky, LUTHER H [[id number]]     4. Hypothyroidism due to Hashimoto's thyroiditis Follow up with PCP - TSH + free T4 - T3,  free  5. Screening cholesterol level - Lipid panel  6. Screening for diabetes mellitus - Hemoglobin A1c

## 2022-07-12 LAB — COMPREHENSIVE METABOLIC PANEL
ALT: 17 IU/L (ref 0–32)
AST: 22 IU/L (ref 0–40)
Albumin/Globulin Ratio: 1.6 (ref 1.2–2.2)
Albumin: 4.2 g/dL (ref 3.8–4.9)
Alkaline Phosphatase: 78 IU/L (ref 44–121)
BUN/Creatinine Ratio: 16 (ref 9–23)
BUN: 13 mg/dL (ref 6–24)
Bilirubin Total: 0.3 mg/dL (ref 0.0–1.2)
CO2: 22 mmol/L (ref 20–29)
Calcium: 9.3 mg/dL (ref 8.7–10.2)
Chloride: 102 mmol/L (ref 96–106)
Creatinine, Ser: 0.83 mg/dL (ref 0.57–1.00)
Globulin, Total: 2.7 g/dL (ref 1.5–4.5)
Glucose: 88 mg/dL (ref 70–99)
Potassium: 4.6 mmol/L (ref 3.5–5.2)
Sodium: 138 mmol/L (ref 134–144)
Total Protein: 6.9 g/dL (ref 6.0–8.5)
eGFR: 85 mL/min/{1.73_m2} (ref >59)

## 2022-07-12 LAB — CBC
Hematocrit: 42.4 % (ref 34.0–46.6)
Hemoglobin: 13.6 g/dL (ref 11.1–15.9)
MCH: 28.6 pg (ref 26.6–33.0)
MCHC: 32.1 g/dL (ref 31.5–35.7)
MCV: 89 fL (ref 79–97)
Platelets: 311 10*3/uL (ref 150–450)
RBC: 4.76 x10E6/uL (ref 3.77–5.28)
RDW: 12.5 % (ref 11.7–15.4)
WBC: 5.8 10*3/uL (ref 3.4–10.8)

## 2022-07-12 LAB — TSH+FREE T4
Free T4: 1.56 ng/dL (ref 0.82–1.77)
TSH: 2.16 u[IU]/mL (ref 0.450–4.500)

## 2022-07-12 LAB — LIPID PANEL
Chol/HDL Ratio: 4.2 ratio (ref 0.0–4.4)
Cholesterol, Total: 178 mg/dL (ref 100–199)
HDL: 42 mg/dL (ref >39)
LDL Chol Calc (NIH): 121 mg/dL — ABNORMAL HIGH (ref 0–99)
Triglycerides: 83 mg/dL (ref 0–149)
VLDL Cholesterol Cal: 15 mg/dL (ref 5–40)

## 2022-07-12 LAB — T3, FREE: T3, Free: 3.1 pg/mL (ref 2.0–4.4)

## 2022-07-12 LAB — HEMOGLOBIN A1C
Est. average glucose Bld gHb Est-mCnc: 114 mg/dL
Hgb A1c MFr Bld: 5.6 % (ref 4.8–5.6)

## 2022-07-13 ENCOUNTER — Ambulatory Visit: Payer: Commercial Managed Care - PPO | Admitting: Adult Health

## 2022-07-14 ENCOUNTER — Ambulatory Visit: Payer: Commercial Managed Care - PPO | Admitting: Adult Health

## 2022-07-24 ENCOUNTER — Other Ambulatory Visit: Payer: Self-pay | Admitting: Adult Health

## 2023-01-08 ENCOUNTER — Other Ambulatory Visit: Payer: Self-pay | Admitting: Adult Health

## 2023-01-08 MED ORDER — FLUCONAZOLE 150 MG PO TABS: ORAL_TABLET | ORAL | 1 refills | Status: DC

## 2023-01-08 NOTE — Progress Notes (Signed)
Rx diflucan.

## 2023-03-26 ENCOUNTER — Other Ambulatory Visit: Payer: Self-pay | Admitting: Adult Health

## 2023-03-26 MED ORDER — VALACYCLOVIR HCL 1 G PO TABS: ORAL_TABLET | ORAL | 0 refills | Status: AC

## 2023-03-26 NOTE — Progress Notes (Signed)
Refilled valtrex  

## 2023-04-06 ENCOUNTER — Other Ambulatory Visit: Payer: Self-pay | Admitting: Adult Health

## 2023-04-06 MED ORDER — SULFAMETHOXAZOLE-TRIMETHOPRIM 800-160 MG PO TABS: 1.0000 | ORAL_TABLET | Freq: Two times a day (BID) | ORAL | 0 refills | Status: DC

## 2023-04-06 MED ORDER — PHENAZOPYRIDINE HCL 200 MG PO TABS: 200.0000 mg | ORAL_TABLET | Freq: Three times a day (TID) | ORAL | 0 refills | Status: DC | PRN

## 2023-04-06 NOTE — Progress Notes (Signed)
Has UTI symptoms will rx septra ds and pyridium

## 2023-04-17 ENCOUNTER — Other Ambulatory Visit: Payer: Self-pay

## 2023-04-17 ENCOUNTER — Telehealth: Payer: Self-pay | Admitting: "Endocrinology

## 2023-04-17 DIAGNOSIS — E782 Mixed hyperlipidemia: Secondary | ICD-10-CM

## 2023-04-17 DIAGNOSIS — E063 Autoimmune thyroiditis: Secondary | ICD-10-CM

## 2023-04-17 NOTE — Telephone Encounter (Signed)
Please add lab orders for pt. She has not been in a year due to her insurance and her PCP was refilling her synthroid. Thank you

## 2023-04-17 NOTE — Telephone Encounter (Signed)
Orders for labs updated and sent to [facility].

## 2023-04-25 ENCOUNTER — Other Ambulatory Visit: Payer: Self-pay | Admitting: Adult Health

## 2023-04-25 MED ORDER — METRONIDAZOLE 0.75 % VA GEL: 1.0000 | Freq: Every day | VAGINAL | 0 refills | Status: AC

## 2023-04-25 NOTE — Progress Notes (Signed)
Refilled metrogel 

## 2023-05-03 ENCOUNTER — Other Ambulatory Visit: Payer: Self-pay | Admitting: Adult Health

## 2023-05-03 DIAGNOSIS — Z01419 Encounter for gynecological examination (general) (routine) without abnormal findings: Secondary | ICD-10-CM

## 2023-05-03 DIAGNOSIS — Z131 Encounter for screening for diabetes mellitus: Secondary | ICD-10-CM

## 2023-05-03 NOTE — Progress Notes (Signed)
Ck CBC,CMP and A1c, has physical in March. Dr Fransico Him checking lipids and thyroid

## 2023-05-25 LAB — T4, FREE: Free T4: 1.39 ng/dL (ref 0.82–1.77)

## 2023-05-25 LAB — COMPREHENSIVE METABOLIC PANEL
ALT: 14 [IU]/L (ref 0–32)
AST: 20 [IU]/L (ref 0–40)
Albumin: 4.1 g/dL (ref 3.8–4.9)
Alkaline Phosphatase: 74 [IU]/L (ref 44–121)
BUN/Creatinine Ratio: 19 (ref 9–23)
BUN: 15 mg/dL (ref 6–24)
Bilirubin Total: 0.4 mg/dL (ref 0.0–1.2)
CO2: 22 mmol/L (ref 20–29)
Calcium: 9.2 mg/dL (ref 8.7–10.2)
Chloride: 102 mmol/L (ref 96–106)
Creatinine, Ser: 0.81 mg/dL (ref 0.57–1.00)
Globulin, Total: 2.8 g/dL (ref 1.5–4.5)
Glucose: 90 mg/dL (ref 70–99)
Potassium: 4.5 mmol/L (ref 3.5–5.2)
Sodium: 138 mmol/L (ref 134–144)
Total Protein: 6.9 g/dL (ref 6.0–8.5)
eGFR: 88 mL/min/{1.73_m2} (ref >59)

## 2023-05-25 LAB — HEMOGLOBIN A1C
Est. average glucose Bld gHb Est-mCnc: 123 mg/dL
Hgb A1c MFr Bld: 5.9 % — ABNORMAL HIGH (ref 4.8–5.6)

## 2023-05-25 LAB — CBC
Hematocrit: 39.5 % (ref 34.0–46.6)
Hemoglobin: 12.4 g/dL (ref 11.1–15.9)
MCH: 26.6 pg (ref 26.6–33.0)
MCHC: 31.4 g/dL — ABNORMAL LOW (ref 31.5–35.7)
MCV: 85 fL (ref 79–97)
Platelets: 301 10*3/uL (ref 150–450)
RBC: 4.67 x10E6/uL (ref 3.77–5.28)
RDW: 14.2 % (ref 11.7–15.4)
WBC: 5.3 10*3/uL (ref 3.4–10.8)

## 2023-05-25 LAB — LIPID PANEL
Chol/HDL Ratio: 4 {ratio} (ref 0.0–4.4)
Cholesterol, Total: 182 mg/dL (ref 100–199)
HDL: 46 mg/dL (ref >39)
LDL Chol Calc (NIH): 122 mg/dL — ABNORMAL HIGH (ref 0–99)
Triglycerides: 75 mg/dL (ref 0–149)
VLDL Cholesterol Cal: 14 mg/dL (ref 5–40)

## 2023-05-25 LAB — TSH: TSH: 1.62 u[IU]/mL (ref 0.450–4.500)

## 2023-06-11 ENCOUNTER — Encounter: Payer: Self-pay | Admitting: "Endocrinology

## 2023-06-11 ENCOUNTER — Ambulatory Visit: Payer: BC Managed Care – PPO | Admitting: "Endocrinology

## 2023-06-11 VITALS — BP 98/70 | HR 92 | Ht 63.0 in | Wt 165.8 lb

## 2023-06-11 DIAGNOSIS — E782 Mixed hyperlipidemia: Secondary | ICD-10-CM

## 2023-06-11 DIAGNOSIS — E063 Autoimmune thyroiditis: Secondary | ICD-10-CM | POA: Diagnosis not present

## 2023-06-11 DIAGNOSIS — R7303 Prediabetes: Secondary | ICD-10-CM | POA: Diagnosis not present

## 2023-06-11 NOTE — Patient Instructions (Signed)

## 2023-06-11 NOTE — Progress Notes (Signed)
 06/11/2023, 3:48 PM      Endocrinology follow-up note   Kristen Hayes is a 53 y.o.-year-old female patient who is returning for follow-up for hypothyroidism due to Hashimoto's thyroiditis.   PMD  Richardean Chimera, MD.   Past Medical History:  Diagnosis Date   Constipation    Cough 01/15/2015   Hemorrhoids 02/12/2013   Hypothyroid 02/12/2013   Nasal congestion 05/26/2015   Otitis media of right ear 01/15/2015   Sore throat 01/15/2015   Thyroid disease    Vaginal discharge 09/10/2013    Past Surgical History:  Procedure Laterality Date   BAND HEMORRHOIDECTOMY      Social History   Socioeconomic History   Marital status: Married    Spouse name: Not on file   Number of children: Not on file   Years of education: Not on file   Highest education level: Not on file  Occupational History   Not on file  Tobacco Use   Smoking status: Never   Smokeless tobacco: Never  Vaping Use   Vaping status: Never Used  Substance and Sexual Activity   Alcohol use: No   Drug use: No   Sexual activity: Yes    Birth control/protection: Other-see comments    Comment: vasectomy  Other Topics Concern   Not on file  Social History Narrative   Not on file   Social Drivers of Health   Financial Resource Strain: Low Risk  (07/11/2022)   Overall Financial Resource Strain (CARDIA)    Difficulty of Paying Living Expenses: Not hard at all  Food Insecurity: No Food Insecurity (07/11/2022)   Hunger Vital Sign    Worried About Running Out of Food in the Last Year: Never true    Ran Out of Food in the Last Year: Never true  Transportation Needs: No Transportation Needs (07/11/2022)   PRAPARE - Administrator, Civil Service (Medical): No    Lack of Transportation (Non-Medical): No  Physical Activity: Inactive (07/11/2022)   Exercise Vital Sign    Days of Exercise per Week: 0  days    Minutes of Exercise per Session: 0 min  Stress: No Stress Concern Present (07/11/2022)   [employer] of Occupational Health - Occupational Stress Questionnaire    Feeling of Stress : Not at all  Social Connections: Socially Integrated (07/11/2022)   Social Connection and Isolation Panel [NHANES]    Frequency of Communication with Friends and Family: More than three times a week    Frequency of Social Gatherings with Friends and Family: More than three times a week    Attends Religious Services: More than 4 times per year    Active Member of [employer] or Organizations: Yes    Attends Engineer, structural: More than 4 times per year    Marital Status: Married  Family History  Problem Relation Age of Onset   Heart disease Mother        heart attack   Thyroid disease Mother    Hypertension Mother    Hyperlipidemia Mother    Heart attack Mother    Diabetes Father    Heart disease Father    Hypertension Father    Hyperlipidemia Father    Heart attack Father    Bowel Disease Brother        Crohn's    Outpatient Encounter Medications as of 06/11/2023  Medication Sig   levothyroxine (SYNTHROID) 75 MCG tablet TAKE ONE TABLET BY MOUTH ONCE DAILY BEFORE BREAKFAST   metroNIDAZOLE (METROGEL) 0.75 % vaginal gel Place 1 Applicatorful vaginally at bedtime.   Omega-3 Fatty Acids (FISH OIL PO) Take by mouth.   omeprazole (PRILOSEC) 40 MG capsule Take 1 capsule (40 mg total) by mouth daily.   valACYclovir (VALTREX) 1000 MG tablet TAKE TWO (2) TABLETS BY MOUTH AT FIRST SIGN OF COLD SORE, AND REPEAT IN 24 HOURS   VITAMIN D PO Take by mouth.   [DISCONTINUED] fluconazole (DIFLUCAN) 150 MG tablet Take 1 now and 1 in 3 days   [DISCONTINUED] phenazopyridine (PYRIDIUM) 200 MG tablet Take 1 tablet (200 mg total) by mouth 3 (three) times daily as needed for pain.   [DISCONTINUED] sulfamethoxazole-trimethoprim (BACTRIM DS) 800-160 MG tablet Take 1 tablet by mouth 2 (two) times daily.  Take 1 bid   No facility-administered encounter medications on file as of 06/11/2023.    ALLERGIES: No Known Allergies VACCINATION STATUS: Immunization History  Administered Date(s) Administered   Influenza-Unspecified 12/30/2013   Moderna Sars-Covid-2 Vaccination 11/17/2019, 12/15/2019     HPI    Kristen Hayes  is a patient with the above medical history. she was diagnosed  with hypothyroidism at approximate age of 26 years with which required subsequnt initiation of thyroid hormone replacement.  She remains on levothyroxine 75 mcg p.o. daily before breakfast.  Her previsit thyroid function tests are consistent with appropriate replacement.  Her labs also show uncontrolled dyslipidemia and prediabetes.  She has no new complaints today.  -She presents with progressive weight gain of 7 pounds in the last year.      She is known to have fluctuating body weight.  She denies palpitations, tremors, or heat intolerance.   She admits to dietary indiscretions.  -She denies dysphagia, shortness of breath, voice change.  She has family history of what appears to be hypothyroidism in her mother. -She is a mother of 2 grown children and has grand children.  Pt denies feeling nodules in neck, hoarseness.  She recently underwent thyroid ultrasound which did not show any significant nodular lesions, but bilaterally shrunk lobes.  she denies any family history of thyroid malignancy.  She does have family history of coronary artery disease.  No history of thyroidectomy, nor radiation therapy to head or neck.   ROS: Limited as above.  Physical Exam: BP 98/70   Pulse 92   Ht 5\' 3"  (1.6 m)   Wt 165 lb 12.8 oz (75.2 kg)   BMI 29.37 kg/m  Wt Readings from Last 3 Encounters:  06/11/23 165 lb 12.8 oz (75.2 kg)  07/11/22 162 lb (73.5 kg)  11/04/21 158 lb 6.4 oz (71.8 kg)      CMP ( most recent) CMP     Component Value Date/Time   NA 138 05/24/2023 0806   K 4.5 05/24/2023 0806   CL 102  05/24/2023 0806  CO2 22 05/24/2023 0806   GLUCOSE 90 05/24/2023 0806   GLUCOSE 86 02/27/2014 0906   BUN 15 05/24/2023 0806   CREATININE 0.81 05/24/2023 0806   CREATININE 0.71 02/27/2014 0906   CALCIUM 9.2 05/24/2023 0806   PROT 6.9 05/24/2023 0806   ALBUMIN 4.1 05/24/2023 0806   AST 20 05/24/2023 0806   ALT 14 05/24/2023 0806   ALKPHOS 74 05/24/2023 0806   BILITOT 0.4 05/24/2023 0806   GFRNONAA 93 10/31/2019 0842   GFRAA 107 10/31/2019 0842     Lipid Panel ( most recent) Lipid Panel     Component Value Date/Time   CHOL 182 05/24/2023 0807   TRIG 75 05/24/2023 0807   HDL 46 05/24/2023 0807   CHOLHDL 4.0 05/24/2023 0807   CHOLHDL 3.3 02/27/2014 0906   VLDL 13 02/27/2014 0906   LDLCALC 122 (H) 05/24/2023 0807   LABVLDL 14 05/24/2023 0807     Lab Results  Component Value Date   TSH 1.620 05/24/2023   TSH 2.160 07/11/2022   TSH 2.490 10/06/2021   TSH 2.680 04/15/2021   TSH 2.730 12/03/2020   TSH 0.545 03/19/2020   TSH 0.056 (L) 10/31/2019   TSH 0.022 (L) 06/19/2019   TSH 0.053 (L) 11/06/2018   TSH 0.953 09/04/2017   FREET4 1.39 05/24/2023   FREET4 1.56 07/11/2022   FREET4 1.22 10/06/2021   FREET4 1.35 04/15/2021   FREET4 1.47 12/03/2020   FREET4 1.37 03/19/2020   FREET4 1.78 (H) 10/31/2019   FREET4 1.73 06/19/2019   FREET4 1.58 11/06/2018     FINDINGS:  Parenchymal Echotexture: Moderately heterogenous   Isthmus: 3 mm   Right lobe: 3.6 x 1.2 x 1.7 cm   Left lobe: 3.1 x 1.0 x 1.3 cm   ______________________________________________________  IMPRESSION:  Mildly atrophic heterogeneous thyroid gland compatible with sequelae from prior thyroiditis.  No significant nodule or other finding by ultrasound.    ASSESSMENT: 1. Hypothyroidism 2.  Hashimoto's thyroiditis 3.  Hyperlipidemia 4.  Prediabetes   PLAN:  Her previsit labs are consistent with appropriate replacement.  She is advised to continue levothyroxine 75 mcg p.o. daily before breakfast.      - We discussed about the correct intake of her thyroid hormone, on empty stomach at fasting, with water, separated by at least 30 minutes from breakfast and other medications,  and separated by more than 4 hours from calcium, iron, multivitamins, acid reflux medications (PPIs). -Patient is made aware of the fact that thyroid hormone replacement is needed for life, dose to be adjusted by periodic monitoring of thyroid function tests.  For her hyperlipidemia, prediabetes, overweight she will not need statin treatment for now.  However, she is open for lifestyle medicine.  She would like to avoid medications including statins.   - she acknowledges that there is a room for improvement in her food and drink choices. - Suggestion is made for her to avoid simple carbohydrates  from her diet including Cakes, Sweet Desserts, Ice Cream, Soda (diet and regular), Sweet Tea, Candies, Chips, Cookies, Store Bought Juices, Alcohol , Artificial Sweeteners,  Coffee Creamer, and "Sugar-free" Products, Lemonade. This will help patient to have more stable blood glucose profile and potentially avoid unintended weight gain.  The following Lifestyle Medicine recommendations according to American College of Lifestyle Medicine  [facility]) were discussed and and offered to patient and she  agrees to start the journey:  A. Whole Foods, Plant-Based Nutrition comprising of fruits and vegetables, plant-based proteins, whole-grain carbohydrates was discussed in  detail with the patient.   A list for source of those nutrients were also provided to the patient.  Patient will use only water or unsweetened tea for hydration. B.  The need to stay away from risky substances including alcohol, smoking; obtaining 7 to 9 hours of restorative sleep, at least 150 minutes of moderate intensity exercise weekly, the importance of healthy social connections,  and stress management techniques were discussed. C.  A full color page of  Calorie density of  various food groups per pound showing examples of each food groups was provided to the patient.   Her labs are negative for celiac disease, reassured that she can consume gluten. -Her recent thyroid ultrasound was unremarkable.     She is advised to maintain close follow-up with her PMD Dr. Donzetta Sprung.     I spent  26  minutes in the care of the patient today including review of labs from Thyroid Function, CMP, and other relevant labs ; imaging/biopsy records (current and previous including abstractions from other facilities); face-to-face time discussing  her lab results and symptoms, medications doses, her options of short and long term treatment based on the latest standards of care / guidelines;   and documenting the encounter.  Kristen Hayes  participated in the discussions, expressed understanding, and voiced agreement with the above plans.  All questions were answered to her satisfaction. she is encouraged to contact clinic should she have any questions or concerns prior to her return visit.    No follow-ups on file.  Marquis Lunch, MD [facility] [address] [place], [place] [address] Phone: [phone]  Fax: [phone]   06/11/2023, 3:48 PM  This note was partially dictated with voice recognition software. Similar sounding words can be transcribed inadequately or may not  be corrected upon review.

## 2023-07-13 ENCOUNTER — Ambulatory Visit: Payer: Federal, State, Local not specified - PPO | Admitting: Adult Health

## 2023-07-13 ENCOUNTER — Encounter: Payer: Self-pay | Admitting: Adult Health

## 2023-07-13 ENCOUNTER — Other Ambulatory Visit (HOSPITAL_COMMUNITY)
Admission: RE | Admit: 2023-07-13 | Discharge: 2023-07-13 | Disposition: A | Discharge status: Home / Self Care | Source: Ambulatory Visit | Attending: Adult Health | Admitting: Adult Health

## 2023-07-13 VITALS — BP 134/83 | HR 105 | Ht 63.0 in | Wt 167.0 lb

## 2023-07-13 DIAGNOSIS — Z1331 Encounter for screening for depression: Secondary | ICD-10-CM | POA: Diagnosis not present

## 2023-07-13 DIAGNOSIS — Z1211 Encounter for screening for malignant neoplasm of colon: Secondary | ICD-10-CM | POA: Diagnosis not present

## 2023-07-13 DIAGNOSIS — L918 Other hypertrophic disorders of the skin: Secondary | ICD-10-CM | POA: Diagnosis not present

## 2023-07-13 DIAGNOSIS — Z01419 Encounter for gynecological examination (general) (routine) without abnormal findings: Secondary | ICD-10-CM | POA: Diagnosis not present

## 2023-07-13 LAB — HEMOCCULT GUIAC POC 1CARD (OFFICE): Fecal Occult Blood, POC: NEGATIVE

## 2023-07-13 NOTE — Progress Notes (Signed)
 Patient ID: KAMEREN Hayes, female   DOB: 1970/07/16, 53 y.o.   MRN: [id number] History of Present Illness: Kristen Hayes is a 53 year old white female, married, PM in for a well woman gyn exam.     Component Value Date/Time   DIAGPAP  07/08/2021 0826    - Negative for intraepithelial lesion or malignancy (NILM)   DIAGPAP  11/04/2018 0000    NEGATIVE FOR INTRAEPITHELIAL LESIONS OR MALIGNANCY.   HPVHIGH Negative 07/08/2021 0826   ADEQPAP  07/08/2021 0826    Satisfactory for evaluation; transformation zone component ABSENT.   ADEQPAP  11/04/2018 0000    Satisfactory for evaluation  endocervical/transformation zone component PRESENT.    PCP is Dr Reuel Boom    Current Medications, Allergies, Past Medical History, Past Surgical History, Family History and Social History were reviewed in [employer] electronic medical record.     Review of Systems: Patient denies any daily headaches, hearing loss, fatigue, blurred vision, shortness of breath, chest pain, abdominal pain, problems with bowel movements, urination, or intercourse. No joint pain or mood swings.  Still having periods Has 2 skin tags wants removed    Physical Exam:BP 134/83 (BP Location: Left Arm, Patient Position: Sitting, Cuff Size: Normal)   Pulse (!) 105   Ht 5\' 3"  (1.6 m)   Wt 167 lb (75.8 kg)   LMP 06/26/2023 (Exact Date)   BMI 29.58 kg/m   General:  Well developed, well nourished, no acute distress Skin:  Warm and dry Neck:  Midline trachea, normal thyroid, good ROM, no lymphadenopathy Lungs; Clear to auscultation bilaterally Breast:  No dominant palpable mass, retraction, or nipple discharge Cardiovascular: Regular rate and rhythm Abdomen:  Soft, non tender, no hepatosplenomegaly Pelvic:  External genitalia is normal in appearance, has skin tag left buttock near pantie line and on anterior right thigh.  She gave verbal consent, and base of skin tags was injected with 1.5 cc 2% lidocaine, cleansed with betadine and forceps  applied at base, then remove and iris scissors used to remove skin tags. No bleeding, band aide applied to thigh. Skin tags  sent to pathology.  The vagina is normal in appearance. Urethra has no lesions or masses. The cervix is bulbous.  Uterus is felt to be normal size, shape, and contour.  No adnexal masses or tenderness noted.Bladder is non tender, no masses felt. Rectal: Good sphincter tone, no polyps,+hemorrhoids felt.  Hemoccult negative. Extremities/musculoskeletal:  No swelling or varicosities noted, no clubbing or cyanosis Psych:  No mood changes, alert and cooperative,seems happy AA is 0 Fall risk is low    07/13/2023    8:40 AM 07/11/2022    8:38 AM 07/08/2021    8:30 AM  Depression screen PHQ 2/9  Decreased Interest 0 0 0  Down, Depressed, Hopeless 0 0 0  PHQ - 2 Score 0 0 0  Altered sleeping 0 0 0  Tired, decreased energy 0 0 0  Change in appetite 0 0 0  Feeling bad or failure about yourself  0 0 0  Trouble concentrating 0 0 0  Moving slowly or fidgety/restless 0 0 0  Suicidal thoughts 0 0 0  PHQ-9 Score 0 0 0       07/13/2023    8:40 AM 07/11/2022    8:38 AM 07/08/2021    8:31 AM 12/21/2020    3:37 PM  GAD 7 : Generalized Anxiety Score  Nervous, Anxious, on Edge 0 0 0 0  Control/stop worrying 0 0 0 0  Worry  too much - different things 0 0 0 0  Trouble relaxing 0 0 0 0  Restless 0 0 0 0  Easily annoyed or irritable 0 0 0 0  Afraid - awful might happen 0 0 0 0  Total GAD 7 Score 0 0 0 0    Upstream - 07/13/23 0835       Pregnancy Intention Screening   Does the patient want to become pregnant in the next year? No    Does the patient's partner want to become pregnant in the next year? No    Would the patient like to discuss contraceptive options today? No      Contraception Wrap Up   Current Method Vasectomy    End Method Vasectomy    Contraception Counseling Provided No            Examination chaperoned by Malachy Mood LPN    Impression and plan: 1.  Encounter for well woman exam with routine gynecological exam (Primary) Pap and physical in 1 year Mammogram was negative 06/21/23 Colonoscopy per GI  Stay active Has labs with Dr Fransico Him   2. Encounter for screening fecal occult blood testing Hemoccult was negative - POCT occult blood stool  3. Skin tag Skin stags sent to pathology  - Surgical pathology( [place]/ [facility]) - Surgical pathology( [place]/ [facility])

## 2023-07-16 LAB — SURGICAL PATHOLOGY

## 2023-09-05 LAB — LIPID PANEL
Chol/HDL Ratio: 5.2 ratio — ABNORMAL HIGH (ref 0.0–4.4)
Cholesterol, Total: 196 mg/dL (ref 100–199)
HDL: 38 mg/dL — ABNORMAL LOW (ref >39)
LDL Chol Calc (NIH): 141 mg/dL — ABNORMAL HIGH (ref 0–99)
Triglycerides: 92 mg/dL (ref 0–149)
VLDL Cholesterol Cal: 17 mg/dL (ref 5–40)

## 2023-09-05 LAB — TSH: TSH: 2.43 u[IU]/mL (ref 0.450–4.500)

## 2023-09-05 LAB — T4, FREE: Free T4: 1.52 ng/dL (ref 0.82–1.77)

## 2023-09-06 ENCOUNTER — Telehealth: Payer: Self-pay

## 2023-09-06 ENCOUNTER — Other Ambulatory Visit: Payer: Self-pay

## 2023-09-06 DIAGNOSIS — E782 Mixed hyperlipidemia: Secondary | ICD-10-CM

## 2023-09-06 DIAGNOSIS — E063 Autoimmune thyroiditis: Secondary | ICD-10-CM

## 2023-09-06 NOTE — Telephone Encounter (Signed)
 Pt called stating she has seen her lab test results. Pt stated she would like to move her appointment so she can work more on her diet and exercise to help improve her next results.

## 2023-09-06 NOTE — Telephone Encounter (Signed)
 Spoke with pt making her aware to repeat labs one week prior to her rescheduled appt. Pt voiced understanding.

## 2023-09-17 ENCOUNTER — Ambulatory Visit: Payer: BC Managed Care – PPO | Admitting: "Endocrinology

## 2023-11-21 ENCOUNTER — Other Ambulatory Visit (HOSPITAL_BASED_OUTPATIENT_CLINIC_OR_DEPARTMENT_OTHER): Payer: Self-pay | Admitting: Family Medicine

## 2023-11-21 DIAGNOSIS — Z8249 Family history of ischemic heart disease and other diseases of the circulatory system: Secondary | ICD-10-CM

## 2023-12-10 ENCOUNTER — Other Ambulatory Visit: Payer: Self-pay | Admitting: Adult Health

## 2023-12-10 MED ORDER — FLUCONAZOLE 150 MG PO TABS: ORAL_TABLET | ORAL | 1 refills | Status: AC

## 2023-12-10 NOTE — Progress Notes (Signed)
 Rx diflucan.

## 2023-12-11 ENCOUNTER — Ambulatory Visit (HOSPITAL_COMMUNITY)
Admission: RE | Admit: 2023-12-11 | Discharge: 2023-12-11 | Disposition: A | Discharge status: Home / Self Care | Payer: Self-pay | Source: Ambulatory Visit | Attending: Family Medicine | Admitting: Family Medicine

## 2023-12-11 DIAGNOSIS — Z8249 Family history of ischemic heart disease and other diseases of the circulatory system: Secondary | ICD-10-CM | POA: Insufficient documentation

## 2023-12-12 LAB — LIPID PANEL
Chol/HDL Ratio: 5 ratio — ABNORMAL HIGH (ref 0.0–4.4)
Cholesterol, Total: 186 mg/dL (ref 100–199)
HDL: 37 mg/dL — ABNORMAL LOW (ref >39)
LDL Chol Calc (NIH): 123 mg/dL — ABNORMAL HIGH (ref 0–99)
Triglycerides: 142 mg/dL (ref 0–149)
VLDL Cholesterol Cal: 26 mg/dL (ref 5–40)

## 2023-12-12 LAB — T4, FREE: Free T4: 1.35 ng/dL (ref 0.82–1.77)

## 2023-12-12 LAB — TSH: TSH: 3.25 u[IU]/mL (ref 0.450–4.500)

## 2023-12-19 ENCOUNTER — Telehealth: Payer: Self-pay

## 2023-12-19 ENCOUNTER — Encounter: Payer: Self-pay | Admitting: "Endocrinology

## 2023-12-19 ENCOUNTER — Ambulatory Visit: Admitting: "Endocrinology

## 2023-12-19 VITALS — BP 104/72 | HR 68 | Ht 63.0 in | Wt 156.2 lb

## 2023-12-19 DIAGNOSIS — R7303 Prediabetes: Secondary | ICD-10-CM | POA: Diagnosis not present

## 2023-12-19 DIAGNOSIS — E063 Autoimmune thyroiditis: Secondary | ICD-10-CM | POA: Diagnosis not present

## 2023-12-19 DIAGNOSIS — E559 Vitamin D deficiency, unspecified: Secondary | ICD-10-CM | POA: Diagnosis not present

## 2023-12-19 DIAGNOSIS — E782 Mixed hyperlipidemia: Secondary | ICD-10-CM | POA: Diagnosis not present

## 2023-12-19 LAB — POCT GLYCOSYLATED HEMOGLOBIN (HGB A1C): HbA1c, POC (prediabetic range): 5.7 % (ref 5.7–6.4)

## 2023-12-19 MED ORDER — LEVOTHYROXINE SODIUM 88 MCG PO TABS: ORAL_TABLET | ORAL | 1 refills | Status: AC

## 2023-12-19 NOTE — Telephone Encounter (Signed)
 Kristen Hayes with [facility] called stating the manufacturer of pt's Levothyroxine  is no longer producing the drug and will need to change manufacturing company for her Levothyroxine . Dr.Nida made aware and had no contraindications for switching manufacture. Kristen Hayes with [facility] made aware.

## 2023-12-19 NOTE — Addendum Note (Signed)
 Addended by: CLAUDENE ZADA POUR on: 12/19/2023 04:57 PM   Modules accepted: Orders

## 2023-12-19 NOTE — Progress Notes (Signed)
 12/19/2023, 1:01 PM      Endocrinology follow-up note   Kristen Hayes is a 53 y.o.-year-old female patient who is returning for follow-up for hypothyroidism due to Hashimoto's thyroiditis.   PMD  Toribio Jerel MATSU, MD.   Past Medical History:  Diagnosis Date   Constipation    Cough 01/15/2015   Hemorrhoids 02/12/2013   Hypothyroid 02/12/2013   Nasal congestion 05/26/2015   Otitis media of right ear 01/15/2015   Sore throat 01/15/2015   Thyroid  disease    Vaginal discharge 09/10/2013    Past Surgical History:  Procedure Laterality Date   BAND HEMORRHOIDECTOMY      Social History   Socioeconomic History   Marital status: Married    Spouse name: Not on file   Number of children: Not on file   Years of education: Not on file   Highest education level: Not on file  Occupational History   Not on file  Tobacco Use   Smoking status: Never   Smokeless tobacco: Never  Vaping Use   Vaping status: Never Used  Substance and Sexual Activity   Alcohol use: No   Drug use: No   Sexual activity: Yes    Birth control/protection: Other-see comments    Comment: vasectomy  Other Topics Concern   Not on file  Social History Narrative   Not on file   Social Drivers of Health   Financial Resource Strain: Low Risk  (07/13/2023)   Overall Financial Resource Strain (CARDIA)    Difficulty of Paying Living Expenses: Not hard at all  Food Insecurity: No Food Insecurity (07/13/2023)   Hunger Vital Sign    Worried About Running Out of Food in the Last Year: Never true    Ran Out of Food in the Last Year: Never true  Transportation Needs: No Transportation Needs (07/13/2023)   PRAPARE - Administrator, Civil Service (Medical): No    Lack of Transportation (Non-Medical): No  Physical Activity: Inactive (07/13/2023)   Exercise Vital Sign    Days of Exercise per Week: 0  days    Minutes of Exercise per Session: 0 min  Stress: No Stress Concern Present (07/13/2023)   [employer] of Occupational Health - Occupational Stress Questionnaire    Feeling of Stress : Not at all  Social Connections: Socially Integrated (07/13/2023)   Social Connection and Isolation Panel    Frequency of Communication with Friends and Family: More than three times a week    Frequency of Social Gatherings with Friends and Family: More than three times a week    Attends Religious Services: More than 4 times per year    Active Member of [employer] or Organizations: Yes    Attends Banker Meetings: Never    Marital Status: Married    Family History  Problem Relation Age  of Onset   Heart disease Mother        heart attack   Thyroid  disease Mother    Hypertension Mother    Hyperlipidemia Mother    Heart attack Mother    Diabetes Father    Heart disease Father    Hypertension Father    Hyperlipidemia Father    Heart attack Father    Bowel Disease Brother        Crohn's    Outpatient Encounter Medications as of 12/19/2023  Medication Sig   Cholecalciferol (VITAMIN D) 50 MCG (2000 UT) CAPS Take 2,000 Units by mouth daily with lunch.   fluconazole  (DIFLUCAN ) 150 MG tablet Take 1 now and 1 in 3 days if needed   levothyroxine  (SYNTHROID ) 88 MCG tablet TAKE ONE TABLET BY MOUTH ONCE DAILY BEFORE BREAKFAST   metroNIDAZOLE  (METROGEL ) 0.75 % vaginal gel Place 1 Applicatorful vaginally at bedtime. (Patient not taking: Reported on 07/13/2023)   omeprazole  (PRILOSEC) 40 MG capsule Take 1 capsule (40 mg total) by mouth daily.   valACYclovir  (VALTREX ) 1000 MG tablet TAKE TWO (2) TABLETS BY MOUTH AT FIRST SIGN OF COLD SORE, AND REPEAT IN 24 HOURS   [DISCONTINUED] levothyroxine  (SYNTHROID ) 75 MCG tablet TAKE ONE TABLET BY MOUTH ONCE DAILY BEFORE BREAKFAST   No facility-administered encounter medications on file as of 12/19/2023.    ALLERGIES: No Known Allergies VACCINATION  STATUS: Immunization History  Administered Date(s) Administered   Influenza-Unspecified 12/30/2013   Moderna Sars-Covid-2 Vaccination 11/17/2019, 12/15/2019     HPI    Kristen Hayes  is a patient with the above medical history. she was diagnosed  with hypothyroidism at approximate age of 26 years with which required subsequnt initiation of thyroid  hormone replacement.  She remains on levothyroxine  75 mcg p.o. daily before breakfast.  Her previsit thyroid  function tests are consistent with appropriate replacement.   Her previsit labs show improvement in her lipid panel, point-of-care A1c today 5.7% improving from 5.9% during her previous visit.  She presents with 8 pounds of weight loss as a result of changes in her lifestyle.  She reports that her primary care doctor conducted coronary artery calcium score which was reported to be 0. she has no new complaints today.     She is known to have fluctuating body weight.  She denies palpitations, tremors, or heat intolerance.    -She denies dysphagia, shortness of breath, voice change.  She has family history of what appears to be hypothyroidism in her mother. -She is a mother of 2 grown children and has grand children.  Pt denies feeling nodules in neck, hoarseness.  She recently underwent thyroid  ultrasound which did not show any significant nodular lesions, but bilaterally shrunk lobes.  she denies any family history of thyroid  malignancy.  She does have family history of coronary artery disease.  No history of thyroidectomy, nor radiation therapy to head or neck.   ROS: Limited as above.  Physical Exam: BP 104/72   Pulse 68   Ht 5' 3 (1.6 m)   Wt 156 lb 3.2 oz (70.9 kg)   BMI 27.67 kg/m  Wt Readings from Last 3 Encounters:  12/19/23 156 lb 3.2 oz (70.9 kg)  07/13/23 167 lb (75.8 kg)  06/11/23 165 lb 12.8 oz (75.2 kg)      CMP ( most recent) CMP     Component Value Date/Time   NA 138 05/24/2023 0806   K 4.5 05/24/2023  0806   CL 102 05/24/2023 0806  CO2 22 05/24/2023 0806   GLUCOSE 90 05/24/2023 0806   GLUCOSE 86 02/27/2014 0906   BUN 15 05/24/2023 0806   CREATININE 0.81 05/24/2023 0806   CREATININE 0.71 02/27/2014 0906   CALCIUM 9.2 05/24/2023 0806   PROT 6.9 05/24/2023 0806   ALBUMIN 4.1 05/24/2023 0806   AST 20 05/24/2023 0806   ALT 14 05/24/2023 0806   ALKPHOS 74 05/24/2023 0806   BILITOT 0.4 05/24/2023 0806   GFRNONAA 93 10/31/2019 0842   GFRAA 107 10/31/2019 0842     Lipid Panel ( most recent) Lipid Panel     Component Value Date/Time   CHOL 186 12/11/2023 0804   TRIG 142 12/11/2023 0804   HDL 37 (L) 12/11/2023 0804   CHOLHDL 5.0 (H) 12/11/2023 0804   CHOLHDL 3.3 02/27/2014 0906   VLDL 13 02/27/2014 0906   LDLCALC 123 (H) 12/11/2023 0804   LABVLDL 26 12/11/2023 0804     Lab Results  Component Value Date   TSH 3.250 12/11/2023   TSH 2.430 09/04/2023   TSH 1.620 05/24/2023   TSH 2.160 07/11/2022   TSH 2.490 10/06/2021   TSH 2.680 04/15/2021   TSH 2.730 12/03/2020   TSH 0.545 03/19/2020   TSH 0.056 (L) 10/31/2019   TSH 0.022 (L) 06/19/2019   FREET4 1.35 12/11/2023   FREET4 1.52 09/04/2023   FREET4 1.39 05/24/2023   FREET4 1.56 07/11/2022   FREET4 1.22 10/06/2021   FREET4 1.35 04/15/2021   FREET4 1.47 12/03/2020   FREET4 1.37 03/19/2020   FREET4 1.78 (H) 10/31/2019   FREET4 1.73 06/19/2019     FINDINGS:  Parenchymal Echotexture: Moderately heterogenous   Isthmus: 3 mm   Right lobe: 3.6 x 1.2 x 1.7 cm   Left lobe: 3.1 x 1.0 x 1.3 cm   ______________________________________________________  IMPRESSION:  Mildly atrophic heterogeneous thyroid  gland compatible with sequelae from prior thyroiditis.  No significant nodule or other finding by ultrasound.    ASSESSMENT: 1. Hypothyroidism 2.  Hashimoto's thyroiditis 3.  Hyperlipidemia 4.  Prediabetes   PLAN:  Her previsit labs are states that she will benefit from slight increase in her levothyroxine  dose.  I  discussed and increase her levothyroxine  to 88 mcg p.o. daily before breakfast.     - We discussed about the correct intake of her thyroid  hormone, on empty stomach at fasting, with water, separated by at least 30 minutes from breakfast and other medications,  and separated by more than 4 hours from calcium, iron, multivitamins, acid reflux medications (PPIs). -Patient is made aware of the fact that thyroid  hormone replacement is needed for life, dose to be adjusted by periodic monitoring of thyroid  function tests.   For her hyperlipidemia, prediabetes, overweight: She has optimal engagement in lifestyle medicine.  She presents with 8 pounds of weight loss, she wishes to delay the statin initiation.   Her LDL is at 123 improving from 141.  In light of the fact that she is also improving her prediabetes and calcium score of 0, she has a chance to lower her LDL further by engaging deeper with lifestyle medicine.  She will have another lab work in 6 months and if LDL remains above 100, she may be considered for low-dose statin.   - she acknowledges that there is a room for improvement in her food and drink choices. - Suggestion is made for her to avoid simple carbohydrates  from her diet including Cakes, Sweet Desserts, Ice Cream, Soda (diet and regular), Sweet Tea, Candies, Chips, Cookies,  Store Bought Juices, Alcohol , Artificial Sweeteners,  Coffee Creamer, and Sugar-free Products, Lemonade. This will help patient to have more stable blood glucose profile and potentially avoid unintended weight gain.  The following Lifestyle Medicine recommendations according to American College of Lifestyle Medicine  [facility]) were discussed and and offered to patient and she  agrees to start the journey:  A. Whole Foods, Plant-Based Nutrition comprising of fruits and vegetables, plant-based proteins, whole-grain carbohydrates was discussed in detail with the patient.   A list for source of those nutrients were also  provided to the patient.  Patient will use only water or unsweetened tea for hydration. B.  The need to stay away from risky substances including alcohol, smoking; obtaining 7 to 9 hours of restorative sleep, at least 150 minutes of moderate intensity exercise weekly, the importance of healthy social connections,  and stress management techniques were discussed. C.  A full color page of  Calorie density of various food groups per pound showing examples of each food groups was provided to the patient.   Her labs are negative for celiac disease, reassured that she can consume gluten. -Her recent thyroid  ultrasound was unremarkable.  She will benefit from resumption of vitamin D 3 2000 units daily supplement for the next 2 seasons.   She is advised to maintain close follow-up with her PMD Dr. Jerel Sieving.     I spent  25  minutes in the care of the patient today including review of labs from Thyroid  Function, CMP, and other relevant labs ; imaging/biopsy records (current and previous including abstractions from other facilities); face-to-face time discussing  her lab results and symptoms, medications doses, her options of short and long term treatment based on the latest standards of care / guidelines;   and documenting the encounter.  Lenor VEAR Das  participated in the discussions, expressed understanding, and voiced agreement with the above plans.  All questions were answered to her satisfaction. she is encouraged to contact clinic should she have any questions or concerns prior to her return visit.    Return in about 6 months (around 06/17/2024) for Fasting Labs  in AM B4 8, A1c -NV.  Ranny Earl, MD [facility] [address] [place], [place] [address] Phone: [phone]  Fax: [phone]   12/19/2023, 1:01 PM  This note was partially dictated with voice recognition software. Similar sounding words can be transcribed inadequately or may  not  be corrected upon review.

## 2024-06-25 ENCOUNTER — Ambulatory Visit: Admitting: "Endocrinology
